# Patient Record
Sex: Female | Born: 1965 | Race: Black or African American | Hispanic: No | Marital: Single | Smoking: Current every day smoker
Health system: Southern US, Community
[De-identification: ages and names within clinical notes are randomized; demographics above are authoritative.]

## PROBLEM LIST (undated history)

## (undated) DIAGNOSIS — K219 Gastro-esophageal reflux disease without esophagitis: Secondary | ICD-10-CM

## (undated) DIAGNOSIS — IMO0001 Reserved for inherently not codable concepts without codable children: Secondary | ICD-10-CM

## (undated) DIAGNOSIS — K3184 Gastroparesis: Secondary | ICD-10-CM

## (undated) HISTORY — PX: MYOMECTOMY: SHX85

---

## 2005-08-19 ENCOUNTER — Emergency Department (HOSPITAL_COMMUNITY): Admission: EM | Admit: 2005-08-19 | Discharge: 2005-08-19 | Payer: Self-pay | Admitting: *Deleted

## 2005-12-11 ENCOUNTER — Emergency Department (HOSPITAL_COMMUNITY): Admission: EM | Admit: 2005-12-11 | Discharge: 2005-12-11 | Payer: Self-pay | Admitting: Emergency Medicine

## 2006-03-14 ENCOUNTER — Emergency Department (HOSPITAL_COMMUNITY): Admission: EM | Admit: 2006-03-14 | Discharge: 2006-03-14 | Payer: Self-pay | Admitting: Emergency Medicine

## 2006-05-06 ENCOUNTER — Emergency Department (HOSPITAL_COMMUNITY): Admission: EM | Admit: 2006-05-06 | Discharge: 2006-05-06 | Payer: Self-pay | Admitting: Emergency Medicine

## 2006-07-10 ENCOUNTER — Encounter: Admission: RE | Admit: 2006-07-10 | Discharge: 2006-07-10 | Payer: Self-pay | Admitting: Internal Medicine

## 2006-08-22 ENCOUNTER — Emergency Department (HOSPITAL_COMMUNITY): Admission: EM | Admit: 2006-08-22 | Discharge: 2006-08-22 | Payer: Self-pay | Admitting: Emergency Medicine

## 2006-10-23 ENCOUNTER — Emergency Department (HOSPITAL_COMMUNITY): Admission: EM | Admit: 2006-10-23 | Discharge: 2006-10-23 | Payer: Self-pay | Admitting: Emergency Medicine

## 2006-11-05 ENCOUNTER — Encounter (INDEPENDENT_AMBULATORY_CARE_PROVIDER_SITE_OTHER): Payer: Self-pay | Admitting: Obstetrics and Gynecology

## 2006-11-05 ENCOUNTER — Inpatient Hospital Stay (HOSPITAL_COMMUNITY): Admission: RE | Admit: 2006-11-05 | Discharge: 2006-11-08 | Payer: Self-pay | Admitting: Obstetrics and Gynecology

## 2006-11-16 ENCOUNTER — Emergency Department (HOSPITAL_COMMUNITY): Admission: EM | Admit: 2006-11-16 | Discharge: 2006-11-16 | Payer: Self-pay | Admitting: Emergency Medicine

## 2006-12-29 ENCOUNTER — Emergency Department (HOSPITAL_COMMUNITY): Admission: EM | Admit: 2006-12-29 | Discharge: 2006-12-30 | Payer: Self-pay | Admitting: Emergency Medicine

## 2007-04-22 ENCOUNTER — Inpatient Hospital Stay (HOSPITAL_COMMUNITY): Admission: EM | Admit: 2007-04-22 | Discharge: 2007-04-23 | Payer: Self-pay | Admitting: Emergency Medicine

## 2007-05-05 ENCOUNTER — Ambulatory Visit (HOSPITAL_COMMUNITY): Admission: RE | Admit: 2007-05-05 | Discharge: 2007-05-05 | Payer: Self-pay | Admitting: Gastroenterology

## 2007-08-06 ENCOUNTER — Emergency Department (HOSPITAL_COMMUNITY): Admission: EM | Admit: 2007-08-06 | Discharge: 2007-08-06 | Payer: Self-pay | Admitting: Emergency Medicine

## 2007-11-18 ENCOUNTER — Emergency Department (HOSPITAL_COMMUNITY): Admission: EM | Admit: 2007-11-18 | Discharge: 2007-11-18 | Payer: Self-pay | Admitting: Emergency Medicine

## 2008-08-26 ENCOUNTER — Emergency Department (HOSPITAL_COMMUNITY): Admission: EM | Admit: 2008-08-26 | Discharge: 2008-08-26 | Payer: Self-pay | Admitting: Emergency Medicine

## 2008-10-23 ENCOUNTER — Emergency Department (HOSPITAL_COMMUNITY): Admission: EM | Admit: 2008-10-23 | Discharge: 2008-10-23 | Payer: Self-pay | Admitting: Emergency Medicine

## 2008-10-25 ENCOUNTER — Emergency Department (HOSPITAL_COMMUNITY): Admission: EM | Admit: 2008-10-25 | Discharge: 2008-10-25 | Payer: Self-pay | Admitting: Emergency Medicine

## 2008-11-08 ENCOUNTER — Encounter: Admission: RE | Admit: 2008-11-08 | Discharge: 2008-11-08 | Payer: Self-pay | Admitting: Gastroenterology

## 2008-12-09 ENCOUNTER — Ambulatory Visit (HOSPITAL_COMMUNITY): Admission: RE | Admit: 2008-12-09 | Discharge: 2008-12-09 | Payer: Self-pay | Admitting: Gastroenterology

## 2008-12-11 ENCOUNTER — Emergency Department (HOSPITAL_COMMUNITY): Admission: EM | Admit: 2008-12-11 | Discharge: 2008-12-11 | Payer: Self-pay | Admitting: Emergency Medicine

## 2009-01-19 ENCOUNTER — Emergency Department (HOSPITAL_COMMUNITY): Admission: EM | Admit: 2009-01-19 | Discharge: 2009-01-19 | Payer: Self-pay | Admitting: Emergency Medicine

## 2009-01-22 ENCOUNTER — Emergency Department (HOSPITAL_COMMUNITY): Admission: EM | Admit: 2009-01-22 | Discharge: 2009-01-22 | Payer: Self-pay | Admitting: Emergency Medicine

## 2009-04-17 ENCOUNTER — Emergency Department (HOSPITAL_COMMUNITY): Admission: EM | Admit: 2009-04-17 | Discharge: 2009-04-17 | Payer: Self-pay | Admitting: Emergency Medicine

## 2009-06-02 ENCOUNTER — Emergency Department (HOSPITAL_COMMUNITY): Admission: EM | Admit: 2009-06-02 | Discharge: 2009-06-03 | Payer: Self-pay | Admitting: Emergency Medicine

## 2009-08-25 IMAGING — NM NM HEPATO W/GB/PHARM/[PERSON_NAME]
1 series · 1 of 1 positions shown · non-contrast
Comparison: The prior examination 05/05/2007.

CLINICAL DATA: Nausea and vomiting.

NUCLEAR MEDICINE HEPATOBILIARY IMAGING WITH GALLBLADDER EF
TECHNIQUE: Sequential images of the abdomen were obtained [DATE]
minutes following intravenous administration of
radiopharmaceutical.  After the slow intravenous infusion of
uCg Cholecystokinin, the gallbladder ejection fraction was
determined.
Radiopharmaceutical:  5.5 mCi Nc-MMm Choletec

[gallbladder · 1 of 1 slices shown]
[im 1/1]
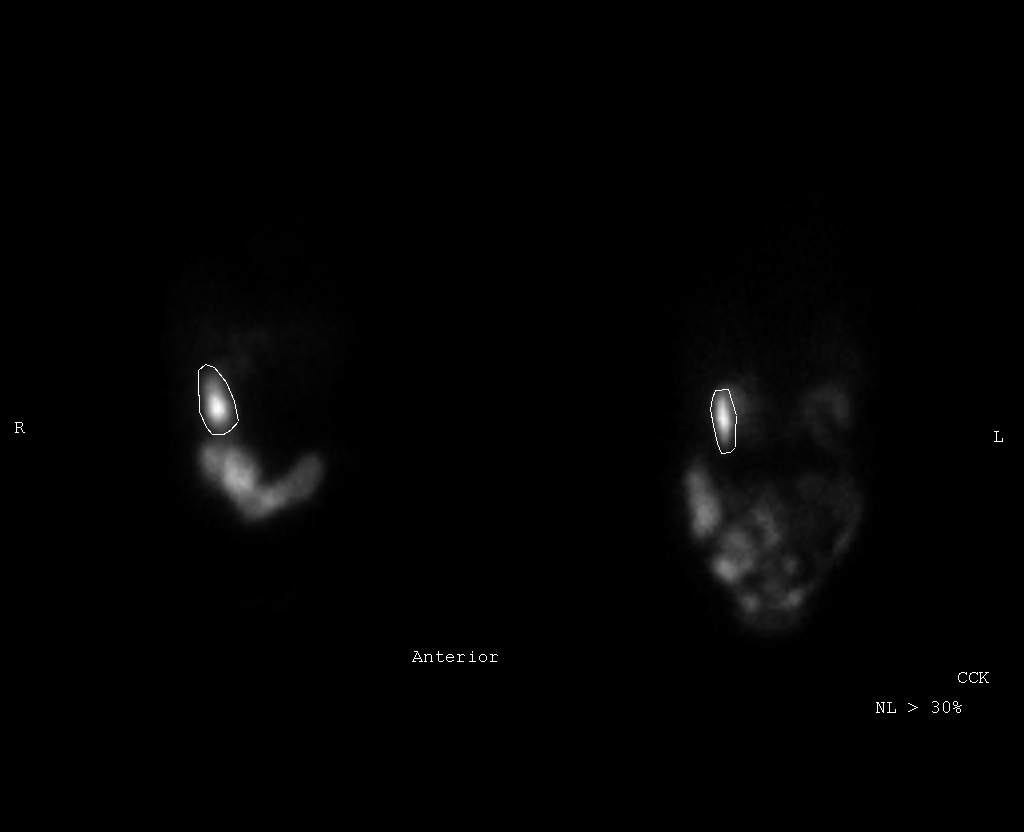

[1 of 1 positions shown; findings below may reference images not displayed]

FINDINGS: Initial images demonstrate homogenous hepatic activity
and prompt filling of the gallbladder and biliary system.  There is
spontaneous small bowel activity.

The stimulated portion of the study demonstrates some gallbladder
contraction and progressive small bowel activity.  The gallbladder
ejection fraction calculated at approximately 30 minutes is 49%%.
Normal ejection fractions are considered greater than 30%.

The patient did experience symptoms (severe nausea and abdominal
pain) during CCK administration.
IMPRESSION: 1.  The cystic and common bile ducts remain patent.
2.  Gallbladder ejection fraction is within normal limits.
3.  The patient did experience severe nausea and abdominal pain
during CCK administration.

## 2009-09-11 ENCOUNTER — Emergency Department (HOSPITAL_COMMUNITY): Admission: EM | Admit: 2009-09-11 | Discharge: 2009-09-11 | Payer: Self-pay | Admitting: Emergency Medicine

## 2009-10-09 ENCOUNTER — Emergency Department (HOSPITAL_COMMUNITY): Admission: EM | Admit: 2009-10-09 | Discharge: 2009-10-09 | Payer: Self-pay | Admitting: Emergency Medicine

## 2009-11-05 ENCOUNTER — Emergency Department (HOSPITAL_COMMUNITY): Admission: EM | Admit: 2009-11-05 | Discharge: 2009-11-05 | Payer: Self-pay | Admitting: Emergency Medicine

## 2009-11-09 ENCOUNTER — Emergency Department (HOSPITAL_COMMUNITY): Admission: EM | Admit: 2009-11-09 | Discharge: 2009-11-09 | Payer: Self-pay | Admitting: Emergency Medicine

## 2010-02-05 ENCOUNTER — Emergency Department (HOSPITAL_COMMUNITY): Admission: EM | Admit: 2010-02-05 | Discharge: 2010-02-06 | Payer: Self-pay | Admitting: Emergency Medicine

## 2010-04-27 ENCOUNTER — Emergency Department (HOSPITAL_COMMUNITY): Admission: EM | Admit: 2010-04-27 | Discharge: 2009-08-05 | Payer: Self-pay | Admitting: Emergency Medicine

## 2010-06-29 ENCOUNTER — Inpatient Hospital Stay (HOSPITAL_COMMUNITY)
Admission: EM | Admit: 2010-06-29 | Discharge: 2010-06-30 | DRG: 392 | Disposition: A | Discharge status: Home / Self Care | Payer: 59 | Attending: Internal Medicine | Admitting: Internal Medicine

## 2010-06-29 ENCOUNTER — Emergency Department (HOSPITAL_COMMUNITY): Payer: 59

## 2010-06-29 DIAGNOSIS — F17200 Nicotine dependence, unspecified, uncomplicated: Secondary | ICD-10-CM | POA: Diagnosis present

## 2010-06-29 DIAGNOSIS — D72829 Elevated white blood cell count, unspecified: Secondary | ICD-10-CM | POA: Diagnosis present

## 2010-06-29 DIAGNOSIS — E860 Dehydration: Secondary | ICD-10-CM | POA: Diagnosis present

## 2010-06-29 DIAGNOSIS — K449 Diaphragmatic hernia without obstruction or gangrene: Secondary | ICD-10-CM | POA: Diagnosis present

## 2010-06-29 DIAGNOSIS — K828 Other specified diseases of gallbladder: Secondary | ICD-10-CM | POA: Diagnosis present

## 2010-06-29 DIAGNOSIS — K838 Other specified diseases of biliary tract: Secondary | ICD-10-CM | POA: Diagnosis present

## 2010-06-29 DIAGNOSIS — R112 Nausea with vomiting, unspecified: Principal | ICD-10-CM | POA: Diagnosis present

## 2010-06-29 DIAGNOSIS — Z880 Allergy status to penicillin: Secondary | ICD-10-CM

## 2010-06-29 LAB — COMPREHENSIVE METABOLIC PANEL
BUN: 8 mg/dL (ref 6–23)
CO2: 25 mEq/L (ref 19–32)
Chloride: 104 mEq/L (ref 96–112)
Creatinine, Ser: 0.89 mg/dL (ref 0.4–1.2)
GFR calc Af Amer: >60 mL/min (ref >60)
GFR calc non Af Amer: >60 mL/min (ref >60)
Glucose, Bld: 138 mg/dL — ABNORMAL HIGH (ref 70–99)
Potassium: 4.1 mEq/L (ref 3.5–5.1)
Sodium: 139 mEq/L (ref 135–145)

## 2010-06-29 LAB — CBC
HCT: 39.2 % (ref 36.0–46.0)
MCH: 31.1 pg (ref 26.0–34.0)
MCHC: 34.7 g/dL (ref 30.0–36.0)
MCV: 89.5 fL (ref 78.0–100.0)
Platelets: 252 10*3/uL (ref 150–400)
RBC: 4.38 MIL/uL (ref 3.87–5.11)
RDW: 13.3 % (ref 11.5–15.5)
WBC: 11.1 10*3/uL — ABNORMAL HIGH (ref 4.0–10.5)

## 2010-06-29 LAB — DIFFERENTIAL
Basophils Absolute: 0 10*3/uL (ref 0.0–0.1)
Lymphocytes Relative: 31 % (ref 12–46)

## 2010-06-29 LAB — URINALYSIS, ROUTINE W REFLEX MICROSCOPIC
Hgb urine dipstick: NEGATIVE
Specific Gravity, Urine: 1.02 (ref 1.005–1.030)
pH: 6 (ref 5.0–8.0)

## 2010-06-30 ENCOUNTER — Ambulatory Visit (HOSPITAL_COMMUNITY): Admission: RE | Admit: 2010-06-30 | Payer: 59 | Source: Ambulatory Visit

## 2010-06-30 LAB — BASIC METABOLIC PANEL
Calcium: 9.8 mg/dL (ref 8.4–10.5)
Creatinine, Ser: 0.66 mg/dL (ref 0.4–1.2)
GFR calc Af Amer: >60 mL/min (ref >60)

## 2010-06-30 LAB — HEMOGLOBIN A1C
Hgb A1c MFr Bld: 6.2 % — ABNORMAL HIGH (ref <5.7)
Mean Plasma Glucose: 131 mg/dL — ABNORMAL HIGH (ref <117)

## 2010-06-30 LAB — CBC
MCH: 30.5 pg (ref 26.0–34.0)
MCHC: 34.8 g/dL (ref 30.0–36.0)
Platelets: 213 10*3/uL (ref 150–400)
RDW: 13.2 % (ref 11.5–15.5)

## 2010-07-02 NOTE — Discharge Summary (Signed)
[name], [name]             ACCOUNT NO.:  [id number]  MEDICAL RECORD NO.:  [id number]           PATIENT TYPE:  I  LOCATION:  1340                         FACILITY:  [facility]  PHYSICIAN:  Talmage Nap, MD  DATE OF BIRTH:  [birth year]  DATE OF ADMISSION:  06/29/2010 DATE OF DISCHARGE:  06/30/2010                        DISCHARGE SUMMARY - REFERRING   The patient is being discharged on her request and demand.  PRIMARY CARE PHYSICIAN:  Merlene Laughter. Renae Gloss, M.D.  GASTROENTEROLOGIST:  Anselmo Rod, MD, [facility]  DISCHARGE DIAGNOSES: 1. 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 2. History of hiatal hernia. 3. History of esophagitis. 4. History of hyperglycemia. 5. Leukocytosis, source is unknown.  HISTORY OF PRESENT ILLNESS:  The patient is a 45 year old African- American female with history of hiatal hernia and esophagitis who was admitted to the hospital on June 29, 2010, by Dr. Isidoro Donning with complaints of nausea and vomiting, which she said to be cyclic every month.  She, however, denied any history of fever.  She denied any chills.  She denied any abdominal discomfort.  She denied any diarrhea.  She denied any dysuria or hematuria.  The nausea and the vomiting was said to be becoming intractable.  Hence, the patient presented to the emergency room to be evaluated.  PREADMISSION MEDICATIONS:  Include Prevacid taken on a p.r.n. basis.  PAST SURGICAL HISTORY: 1. Hernia repair. 2. Myomectomy.  ALLERGIES:  PENICILLIN.  SOCIAL HISTORY:  The patient smokes, could not quantify how much she smokes.  Denies any history of alcohol or illicit drug use.  FAMILY HISTORY:  Was said to be positive for diabetes mellitus.  REVIEW OF SYSTEMS:  Essentially documented in the initial history and physical.  PHYSICAL EXAMINATION:  VITAL SIGNS:  At time the patient was seen by the admitting physician blood pressure  was 191/95, heart rate 78, respiratory rate 18, temperature was 98.6, she was saturating 98% on room air. HEENT:  Pupils were reactive to light and extraocular muscles are intact. NECK:  She had no jugular venous distention.  No carotid bruit.  No lymphadenopathy. CHEST:  Was said to be clear to auscultation.  Heart sounds are 1 and 2. ABDOMEN:  Said to be soft with tenderness on deep palpation.  Liver, spleen and kidney palpable.  Bowel sounds are positive. EXTREMITIES:  No pedal edema. NEUROLOGIC:  Exam was nonfocal. MUSCULOSKELETAL SYSTEM:  Unremarkable. SKIN:  Showed no ecchymosis.  LABORATORY DATA:  Initial pregnancy test done was negative.  Complete blood count with differential showed WBC 11.1, hemoglobin 13.6, hematocrit of 39.2, MCV 89.5 with a platelet count of 252.  Normal differential.  Urinalysis unremarkable.  Comprehensive metabolic panel showed sodium of 139, potassium of 4.1, chloride of 104 with a bicarbonate of 25, glucose is 138, BUN is 8, creatinine 0.89.  LFTs normal.  Lipase 19.  A repeat complete blood count with no differential done on June 30, 2010, showed WBC of 12.8, hemoglobin of 12.8, hematocrit of 36.8, MCV 87.8, platelet count of 213.  Basic metabolic panel showed sodium of 132, potassium of 3.5, chloride of  101, with a bicarbonate of 22, glucose is 135, BUN is 6, creatinine 0.66. Hemoglobin A1c 6.2.  RADIOLOGIC:  Imaging studies done on the patient include plain abdominal x-ray which was unremarkable.  HIDA scan-the patient declined to do it.  HOSPITAL COURSE:  The patient was admitted to the regular floor.  She was started on normal saline IV to go at a rate of 125 cc an hour.  Pain control was done with Tylenol.  She was given Protonix 40 mg IV q.24 as well as Zofran 4 mg IV q.4 p.r.n. and she was placed on SCD boots for DVT prophylaxis.  The patient was seen by me for the very first time in this index admission on June 30, 2010, at 9:15  a.m.  She complained of nausea and examination showed the patient to be ill-looking with suboptimal hydration.  Her vital signs then was: blood pressure 140/82, temperature 99.2, pulse 60, respiratory rate 18.  In this encounter Phenergan 12.5 mg IV q.6 p.r.n. for nausea and vomiting was added to the patient's regimen and she was scheduled to have a HIDA scan done.  However, I was called back at about 12:37pm that patient was demanding to see me and she wanted to be discharged home today.  Again on reevaluation, the patient was still ill looking, but she persistently demanded to be discharged.Attempts at convincing patient to stay was unsuccessful. Plan is for the patient to be discharged home today.  DISCHARGE MEDICATIONS:  Medications to be taken at home will include: 1. Reglan 10 mg p.o. t.i.d. 2. Prevacid over-the-counter one p.o. daily p.r.n.     Talmage Nap, MD     CN/MEDQ  D:  06/30/2010  T:  06/30/2010  Job:  [id number]  cc:   Merlene Laughter. Renae Gloss, M.D. Fax: [phone]  JXBJYN WGN FAOZ, MD, [facility] Fax: [phone]  Electronically Signed by Talmage Nap  on 06/30/2010 02:02:09 PM

## 2010-08-03 LAB — BASIC METABOLIC PANEL
BUN: 25 mg/dL — ABNORMAL HIGH (ref 6–23)
Calcium: 9.7 mg/dL (ref 8.4–10.5)
Creatinine, Ser: 2.2 mg/dL — ABNORMAL HIGH (ref 0.4–1.2)
GFR calc non Af Amer: 24 mL/min — ABNORMAL LOW (ref >60)
GFR calc non Af Amer: 37 mL/min — ABNORMAL LOW (ref >60)
Potassium: 3.4 mEq/L — ABNORMAL LOW (ref 3.5–5.1)
Sodium: 138 mEq/L (ref 135–145)

## 2010-08-03 LAB — COMPREHENSIVE METABOLIC PANEL
Albumin: 5 g/dL (ref 3.5–5.2)
Alkaline Phosphatase: 90 U/L (ref 39–117)
BUN: 25 mg/dL — ABNORMAL HIGH (ref 6–23)
Calcium: 11.1 mg/dL — ABNORMAL HIGH (ref 8.4–10.5)
Potassium: 3.1 mEq/L — ABNORMAL LOW (ref 3.5–5.1)
Total Protein: 10.3 g/dL — ABNORMAL HIGH (ref 6.0–8.3)

## 2010-08-03 LAB — URINE MICROSCOPIC-ADD ON

## 2010-08-03 LAB — URINALYSIS, ROUTINE W REFLEX MICROSCOPIC
Glucose, UA: 100 mg/dL — AB
Nitrite: NEGATIVE
Specific Gravity, Urine: 1.038 — ABNORMAL HIGH (ref 1.005–1.030)
pH: 5.5 (ref 5.0–8.0)

## 2010-08-03 LAB — DIFFERENTIAL
Basophils Relative: 0 % (ref 0–1)
Lymphocytes Relative: 15 % (ref 12–46)
Lymphs Abs: 2.6 10*3/uL (ref 0.7–4.0)
Monocytes Absolute: 1.3 10*3/uL — ABNORMAL HIGH (ref 0.1–1.0)
Monocytes Relative: 8 % (ref 3–12)
Neutro Abs: 13.3 10*3/uL — ABNORMAL HIGH (ref 1.7–7.7)

## 2010-08-03 LAB — POCT PREGNANCY, URINE: Preg Test, Ur: NEGATIVE

## 2010-08-03 LAB — CBC
MCHC: 33.8 g/dL (ref 30.0–36.0)
MCV: 91.6 fL (ref 78.0–100.0)
Platelets: 301 10*3/uL (ref 150–400)
RDW: 14.4 % (ref 11.5–15.5)
WBC: 17.2 10*3/uL — ABNORMAL HIGH (ref 4.0–10.5)

## 2010-08-03 LAB — URINE CULTURE
Colony Count: 25000
Culture  Setup Time: 201109190015

## 2010-08-06 LAB — URINALYSIS, ROUTINE W REFLEX MICROSCOPIC
Leukocytes, UA: NEGATIVE
Leukocytes, UA: NEGATIVE
Nitrite: NEGATIVE
Nitrite: NEGATIVE
Specific Gravity, Urine: 1.039 — ABNORMAL HIGH (ref 1.005–1.030)
Specific Gravity, Urine: 1.026 (ref 1.005–1.030)
Urobilinogen, UA: 0.2 mg/dL (ref 0.0–1.0)
Urobilinogen, UA: 1 mg/dL (ref 0.0–1.0)
pH: 5.5 (ref 5.0–8.0)

## 2010-08-06 LAB — DIFFERENTIAL
Basophils Absolute: 0.1 10*3/uL (ref 0.0–0.1)
Basophils Relative: 1 % (ref 0–1)
Lymphocytes Relative: 16 % (ref 12–46)
Monocytes Absolute: 1.5 10*3/uL — ABNORMAL HIGH (ref 0.1–1.0)
Neutro Abs: 11.2 10*3/uL — ABNORMAL HIGH (ref 1.7–7.7)
Neutrophils Relative %: 74 % (ref 43–77)

## 2010-08-06 LAB — URINE CULTURE
Colony Count: NO GROWTH
Culture: NO GROWTH

## 2010-08-06 LAB — COMPREHENSIVE METABOLIC PANEL
Alkaline Phosphatase: 81 U/L (ref 39–117)
BUN: 20 mg/dL (ref 6–23)
CO2: 30 mEq/L (ref 19–32)
Chloride: 91 mEq/L — ABNORMAL LOW (ref 96–112)
Creatinine, Ser: 1.56 mg/dL — ABNORMAL HIGH (ref 0.4–1.2)
GFR calc non Af Amer: 36 mL/min — ABNORMAL LOW (ref >60)
Glucose, Bld: 177 mg/dL — ABNORMAL HIGH (ref 70–99)
Potassium: 3 mEq/L — ABNORMAL LOW (ref 3.5–5.1)
Total Bilirubin: 0.9 mg/dL (ref 0.3–1.2)

## 2010-08-06 LAB — POCT I-STAT, CHEM 8
Calcium, Ion: 1.12 mmol/L (ref 1.12–1.32)
Chloride: 101 mEq/L (ref 96–112)
Glucose, Bld: 139 mg/dL — ABNORMAL HIGH (ref 70–99)
Glucose, Bld: 185 mg/dL — ABNORMAL HIGH (ref 70–99)
HCT: 52 % — ABNORMAL HIGH (ref 36.0–46.0)
HCT: 54 % — ABNORMAL HIGH (ref 36.0–46.0)
Hemoglobin: 17.7 g/dL — ABNORMAL HIGH (ref 12.0–15.0)
Hemoglobin: 18.4 g/dL — ABNORMAL HIGH (ref 12.0–15.0)
Potassium: 2.5 mEq/L — CL (ref 3.5–5.1)
Potassium: 3.3 mEq/L — ABNORMAL LOW (ref 3.5–5.1)
Sodium: 139 mEq/L (ref 135–145)

## 2010-08-06 LAB — URINE MICROSCOPIC-ADD ON

## 2010-08-06 LAB — CBC
HCT: 45.7 % (ref 36.0–46.0)
Hemoglobin: 15.6 g/dL — ABNORMAL HIGH (ref 12.0–15.0)
MCV: 90.7 fL (ref 78.0–100.0)
Platelets: 267 10*3/uL (ref 150–400)
WBC: 15.2 10*3/uL — ABNORMAL HIGH (ref 4.0–10.5)

## 2010-08-06 LAB — POCT PREGNANCY, URINE: Preg Test, Ur: NEGATIVE

## 2010-08-08 LAB — URINE MICROSCOPIC-ADD ON

## 2010-08-08 LAB — DIFFERENTIAL
Eosinophils Relative: 0 % (ref 0–5)
Lymphocytes Relative: 29 % (ref 12–46)
Lymphs Abs: 3.7 10*3/uL (ref 0.7–4.0)
Monocytes Relative: 11 % (ref 3–12)

## 2010-08-08 LAB — COMPREHENSIVE METABOLIC PANEL
ALT: 15 U/L (ref 0–35)
AST: 19 U/L (ref 0–37)
Albumin: 4.5 g/dL (ref 3.5–5.2)
Calcium: 10 mg/dL (ref 8.4–10.5)
Creatinine, Ser: 1.62 mg/dL — ABNORMAL HIGH (ref 0.4–1.2)
GFR calc Af Amer: 42 mL/min — ABNORMAL LOW (ref >60)
GFR calc non Af Amer: 35 mL/min — ABNORMAL LOW (ref >60)
Sodium: 138 mEq/L (ref 135–145)
Total Protein: 9 g/dL — ABNORMAL HIGH (ref 6.0–8.3)

## 2010-08-08 LAB — URINALYSIS, ROUTINE W REFLEX MICROSCOPIC
Nitrite: NEGATIVE
Protein, ur: >300 mg/dL — AB
Specific Gravity, Urine: 1.033 — ABNORMAL HIGH (ref 1.005–1.030)
Urobilinogen, UA: 1 mg/dL (ref 0.0–1.0)

## 2010-08-08 LAB — CBC
MCHC: 33.3 g/dL (ref 30.0–36.0)
MCV: 92.3 fL (ref 78.0–100.0)
Platelets: 277 10*3/uL (ref 150–400)
RDW: 14.1 % (ref 11.5–15.5)

## 2010-08-08 LAB — URINE CULTURE
Colony Count: NO GROWTH
Culture: NO GROWTH

## 2010-08-08 LAB — LIPASE, BLOOD: Lipase: 20 U/L (ref 11–59)

## 2010-08-10 NOTE — H&P (Signed)
NAMEJAQUITA, BESSIRE             ACCOUNT NO.:  [id number]  MEDICAL RECORD NO.:  [id number]           PATIENT TYPE:  E  LOCATION:  WLED                         FACILITY:  [facility]  PHYSICIAN:  Dr. Isidoro Donning                DATE OF BIRTH:  [birth year]  DATE OF ADMISSION:  06/29/2010 DATE OF DISCHARGE:                             HISTORY & PHYSICAL   PRIMARY CARE PHYSICIAN:  Cala Bradford R. Renae Gloss, MD  GASTROENTEROLOGIST:  Anselmo Rod, MD, [facility]  CHIEF COMPLAINT:  Nausea, vomiting.  HISTORY OF PRESENT ILLNESS:  Ms. Driggs is a 45 year old African American female with past medical history of hiatal hernia, esophagitis and prior admissions for intractable nausea and vomiting, who presents with onset of vomiting this morning.  The patient reports similar symptoms associated with monthly menses.  She denies any recent fever, chills, chest pain, abdominal pain, or diarrhea.  She denies melena or hematemesis.  The patient states the Reglan is usually effective at relieving her symptoms; however, she is unable to tolerate p.o. Reglan at this time and IV Reglan is unavailable in the health system.  The patient has undergone workup in the emergency department.  She has received IV Zofran twice as well as IM Phenergan x1 with no relief of symptoms.  The patient is slightly sedated on exam as she did receive Phenergan and IV Ativan to attempt to relieve symptoms.  The patient is to be admitted at this time by triad hospitalist for further evaluation and treatment.  PAST MEDICAL HISTORY: 1. Hiatal hernia per EGD done December 2008. 2. History of upper gastrointestinal bleed secondary to esophagitis     2008. 3. History of hyperglycemia. 4. Remote hernia repair. 5. Exploratory laparotomy, finding uterine fibroids done in June 2008. 6. Tobacco use. 7. Prior admissions for intractable nausea and vomiting.  MEDICATIONS:  Prevacid taken p.r.n.  ALLERGIES:  PENICILLIN with unknown reaction.  FAMILY  HISTORY:  Unable to obtain as the patient is sedated; however, chart review shows family history of diabetes.  SOCIAL HISTORY:  Per sister the patient is single.  She lives alone. She works full-time.  She is a current smoker.  Sister is unaware of how much the patient smokes.  Sister denies any alcohol or illegal drug use.  REVIEW OF SYSTEMS:  As stated in the HPI, however limited secondary to the patient's sedation.  PHYSICAL EXAMINATION:  VITAL SIGNS:  Blood pressure 191/95, heart rate 78, respirations 18, temperature 98.6, O2 sat is 98% on room air. GENERAL:  This is a [place] American female lying supine in stretcher, sleeping in no acute distress. HEENT:  Head is normocephalic, atraumatic.  Eyes, extraocular movements are intact.  No scleral icterus or injection.  Ear, nose and throat, mucous membranes are moist with no oropharyngeal lesions. NECK:  Supple with no thyromegaly or lymphadenopathy.  No JVD or carotid bruits. CHEST:  With symmetrical movement, nontender to palpation. CARDIOVASCULAR:  S1 and S2, regular rate and rhythm.  No murmur, rub, or gallop. RESPIRATORY:  Lung sounds are clear to auscultation bilaterally anteriorly with no wheezes, rales, or crackles.  No increased work of breathing. GI:  Abdomen is soft, nondistended.  The patient does have some grimacing with palpation throughout without any rebound or guarding.  No appreciated masses or hepatosplenomegaly.  Bowel sounds are normoactive. EXTREMITIES:  No lower extremity edema. NEUROLOGICAL:  The patient is able to move all extremities x4 without motor sensory deficit. PSYCHIATRY:  Psychologically, the patient is sedated; however, she is arousable to voice and light touch.  LABORATORY DATA:  White cell count 11.1, platelet count 252, hemoglobin 13.6, hematocrit 39.2, sodium 139, potassium 4.1, chloride 104, CO2 25, BUN 8, creatinine 0.89, serum glucose 138, total bilirubin 0.7, alkaline phosphatase 78,  AST 21, ALT 15, albumin 4.2, lipase 19.  Urinalysis, yellow cloudy with specific gravity of 1.021.  Negative for ketones, protein, nitrites, and leukocytes.  Urine pregnancy is negative.  ASSESSMENT/PLAN: 1. Intractable nausea, vomiting.  Unclear etiology. As mentioned above,     the patient has history of same. Uncertain whether the patient's     symptoms are due to cyclic vomiting syndrome versus viral etiology     versus mechanical versus other.  Again, the patient states symptoms     are associated with premenstrual states.  We will admit the patient     overnight for supportive treatment to include IV Zofran both     scheduled and p.r.n. as IV Reglan is unavailable at the Health     System.  No evidence of infectious etiology as the patient is     afebrile and nontoxic-appearing.  Urinalysis is unremarkable.  We     will check plain film to rule out any acute process.  We will     consider GI consult if the patient's symptoms persist.  The patient     to be on clear diet to advance as tolerated. 2. Leukocytosis, mild.  Suspect a stress reaction in setting of above     problems.  Again, no sign of infectious     etiology as the patient is afebrile and nontoxic-appearing.  We     will continue to monitor. 3. Hiatal hernia.  We will order for PPI therapy. 4. Prophylaxis order for SCDs for DVT prophylaxis.     Cordelia Pen, NP   ______________________________ Dr. Isidoro Donning    LE/MEDQ  D:  06/29/2010  T:  06/29/2010  Job:  [id number]  cc:   Merlene Laughter. Renae Gloss, M.D. Fax: [phone]  MWUXLK GMW NUUV, MD, [facility] Fax: [phone]  Electronically Signed by Cordelia Pen NP on 07/17/2010 09:24:44 AM Electronically Signed by Lindalee Huizinga  on 08/10/2010 10:56:48 AM

## 2010-08-13 LAB — COMPREHENSIVE METABOLIC PANEL
ALT: 18 U/L (ref 0–35)
Alkaline Phosphatase: 95 U/L (ref 39–117)
BUN: 22 mg/dL (ref 6–23)
CO2: 28 mEq/L (ref 19–32)
Calcium: 10.5 mg/dL (ref 8.4–10.5)
GFR calc non Af Amer: 42 mL/min — ABNORMAL LOW (ref >60)
Glucose, Bld: 166 mg/dL — ABNORMAL HIGH (ref 70–99)
Potassium: 3.1 mEq/L — ABNORMAL LOW (ref 3.5–5.1)
Sodium: 136 mEq/L (ref 135–145)

## 2010-08-13 LAB — DIFFERENTIAL
Basophils Relative: 0 % (ref 0–1)
Eosinophils Absolute: 0 10*3/uL (ref 0.0–0.7)
Neutro Abs: 11.4 10*3/uL — ABNORMAL HIGH (ref 1.7–7.7)
Neutrophils Relative %: 76 % (ref 43–77)

## 2010-08-13 LAB — CBC
HCT: 47.7 % — ABNORMAL HIGH (ref 36.0–46.0)
Hemoglobin: 15.8 g/dL — ABNORMAL HIGH (ref 12.0–15.0)
MCHC: 33 g/dL (ref 30.0–36.0)
RBC: 5.16 MIL/uL — ABNORMAL HIGH (ref 3.87–5.11)

## 2010-08-13 LAB — URINALYSIS, ROUTINE W REFLEX MICROSCOPIC
Leukocytes, UA: NEGATIVE
Nitrite: NEGATIVE
Protein, ur: >300 mg/dL — AB
Urobilinogen, UA: 0.2 mg/dL (ref 0.0–1.0)

## 2010-08-13 LAB — PREGNANCY, URINE: Preg Test, Ur: NEGATIVE

## 2010-08-16 ENCOUNTER — Emergency Department (HOSPITAL_COMMUNITY): Payer: 59

## 2010-08-16 ENCOUNTER — Emergency Department (HOSPITAL_COMMUNITY)
Admission: EM | Admit: 2010-08-16 | Discharge: 2010-08-16 | Disposition: A | Discharge status: Home / Self Care | Payer: 59 | Attending: Emergency Medicine | Admitting: Emergency Medicine

## 2010-08-16 ENCOUNTER — Encounter (HOSPITAL_COMMUNITY): Payer: Self-pay

## 2010-08-16 DIAGNOSIS — N289 Disorder of kidney and ureter, unspecified: Secondary | ICD-10-CM | POA: Insufficient documentation

## 2010-08-16 DIAGNOSIS — N852 Hypertrophy of uterus: Secondary | ICD-10-CM | POA: Insufficient documentation

## 2010-08-16 DIAGNOSIS — K219 Gastro-esophageal reflux disease without esophagitis: Secondary | ICD-10-CM | POA: Insufficient documentation

## 2010-08-16 DIAGNOSIS — R112 Nausea with vomiting, unspecified: Secondary | ICD-10-CM | POA: Insufficient documentation

## 2010-08-16 LAB — CBC
Hemoglobin: 16.1 g/dL — ABNORMAL HIGH (ref 12.0–15.0)
MCH: 30.7 pg (ref 26.0–34.0)
MCV: 90.3 fL (ref 78.0–100.0)
RBC: 5.24 MIL/uL — ABNORMAL HIGH (ref 3.87–5.11)

## 2010-08-16 LAB — COMPREHENSIVE METABOLIC PANEL
ALT: 20 U/L (ref 0–35)
AST: 28 U/L (ref 0–37)
CO2: 30 mEq/L (ref 19–32)
Chloride: 92 mEq/L — ABNORMAL LOW (ref 96–112)
Creatinine, Ser: 1.55 mg/dL — ABNORMAL HIGH (ref 0.4–1.2)
GFR calc Af Amer: 44 mL/min — ABNORMAL LOW (ref >60)
GFR calc non Af Amer: 36 mL/min — ABNORMAL LOW (ref >60)
Total Bilirubin: 1.5 mg/dL — ABNORMAL HIGH (ref 0.3–1.2)

## 2010-08-16 LAB — POCT I-STAT, CHEM 8
BUN: 34 mg/dL — ABNORMAL HIGH (ref 6–23)
Chloride: 97 mEq/L (ref 96–112)
Creatinine, Ser: 1.7 mg/dL — ABNORMAL HIGH (ref 0.4–1.2)
Hemoglobin: 17.7 g/dL — ABNORMAL HIGH (ref 12.0–15.0)
Potassium: 4 mEq/L (ref 3.5–5.1)
Sodium: 137 mEq/L (ref 135–145)

## 2010-08-23 LAB — URINALYSIS, ROUTINE W REFLEX MICROSCOPIC
Bilirubin Urine: NEGATIVE
Glucose, UA: NEGATIVE mg/dL
Leukocytes, UA: NEGATIVE
Nitrite: NEGATIVE
Protein, ur: 100 mg/dL — AB
Specific Gravity, Urine: 1.025 (ref 1.005–1.030)
Urobilinogen, UA: 0.2 mg/dL (ref 0.0–1.0)
pH: 5 (ref 5.0–8.0)

## 2010-08-23 LAB — DIFFERENTIAL
Basophils Absolute: 0 10*3/uL (ref 0.0–0.1)
Basophils Relative: 0 % (ref 0–1)
Eosinophils Absolute: 0 10*3/uL (ref 0.0–0.7)
Monocytes Absolute: 1.3 10*3/uL — ABNORMAL HIGH (ref 0.1–1.0)
Neutro Abs: 9.8 10*3/uL — ABNORMAL HIGH (ref 1.7–7.7)
Neutrophils Relative %: 69 % (ref 43–77)

## 2010-08-23 LAB — COMPREHENSIVE METABOLIC PANEL WITH GFR
ALT: 16 U/L (ref 0–35)
AST: 22 U/L (ref 0–37)
Albumin: 4.9 g/dL (ref 3.5–5.2)
Alkaline Phosphatase: 78 U/L (ref 39–117)
BUN: 44 mg/dL — ABNORMAL HIGH (ref 6–23)
CO2: 28 meq/L (ref 19–32)
Calcium: 10.4 mg/dL (ref 8.4–10.5)
Chloride: 91 meq/L — ABNORMAL LOW (ref 96–112)
Creatinine, Ser: 2.49 mg/dL — ABNORMAL HIGH (ref 0.4–1.2)
GFR calc non Af Amer: 21 mL/min — ABNORMAL LOW
Glucose, Bld: 157 mg/dL — ABNORMAL HIGH (ref 70–99)
Potassium: 3.3 meq/L — ABNORMAL LOW (ref 3.5–5.1)
Sodium: 135 meq/L (ref 135–145)
Total Bilirubin: 1.1 mg/dL (ref 0.3–1.2)
Total Protein: 9.7 g/dL — ABNORMAL HIGH (ref 6.0–8.3)

## 2010-08-23 LAB — CBC
HCT: 47.5 % — ABNORMAL HIGH (ref 36.0–46.0)
Hemoglobin: 15.9 g/dL — ABNORMAL HIGH (ref 12.0–15.0)
MCHC: 33.5 g/dL (ref 30.0–36.0)
MCV: 92.7 fL (ref 78.0–100.0)
Platelets: 280 K/uL (ref 150–400)
RBC: 5.13 MIL/uL — ABNORMAL HIGH (ref 3.87–5.11)
RDW: 13.8 % (ref 11.5–15.5)
WBC: 14.3 K/uL — ABNORMAL HIGH (ref 4.0–10.5)

## 2010-08-23 LAB — LIPASE, BLOOD: Lipase: 13 U/L (ref 11–59)

## 2010-08-23 LAB — URINE MICROSCOPIC-ADD ON

## 2010-08-25 LAB — COMPREHENSIVE METABOLIC PANEL
ALT: 14 U/L (ref 0–35)
ALT: 16 U/L (ref 0–35)
AST: 20 U/L (ref 0–37)
AST: 27 U/L (ref 0–37)
Albumin: 4.1 g/dL (ref 3.5–5.2)
Albumin: 4.7 g/dL (ref 3.5–5.2)
Alkaline Phosphatase: 75 U/L (ref 39–117)
CO2: 32 mEq/L (ref 19–32)
Calcium: 9.9 mg/dL (ref 8.4–10.5)
Chloride: 95 mEq/L — ABNORMAL LOW (ref 96–112)
GFR calc Af Amer: 42 mL/min — ABNORMAL LOW (ref >60)
GFR calc Af Amer: >60 mL/min (ref >60)
Potassium: 2.8 mEq/L — ABNORMAL LOW (ref 3.5–5.1)
Sodium: 136 mEq/L (ref 135–145)
Total Bilirubin: 0.9 mg/dL (ref 0.3–1.2)
Total Protein: 8.2 g/dL (ref 6.0–8.3)

## 2010-08-25 LAB — URINE MICROSCOPIC-ADD ON

## 2010-08-25 LAB — URINALYSIS, ROUTINE W REFLEX MICROSCOPIC
Bilirubin Urine: NEGATIVE
Glucose, UA: NEGATIVE mg/dL
Specific Gravity, Urine: 1.03 (ref 1.005–1.030)
Specific Gravity, Urine: 1.033 — ABNORMAL HIGH (ref 1.005–1.030)
Urobilinogen, UA: 1 mg/dL (ref 0.0–1.0)
pH: 6 (ref 5.0–8.0)

## 2010-08-25 LAB — CBC
MCHC: 33.7 g/dL (ref 30.0–36.0)
Platelets: 261 10*3/uL (ref 150–400)
Platelets: 284 10*3/uL (ref 150–400)
RBC: 4.66 MIL/uL (ref 3.87–5.11)
RDW: 13.8 % (ref 11.5–15.5)
WBC: 17.4 10*3/uL — ABNORMAL HIGH (ref 4.0–10.5)

## 2010-08-25 LAB — DIFFERENTIAL
Basophils Absolute: 0.1 10*3/uL (ref 0.0–0.1)
Basophils Relative: 1 % (ref 0–1)
Eosinophils Absolute: 0 10*3/uL (ref 0.0–0.7)
Eosinophils Relative: 0 % (ref 0–5)
Eosinophils Relative: 0 % (ref 0–5)
Lymphocytes Relative: 18 % (ref 12–46)
Lymphs Abs: 4.4 10*3/uL — ABNORMAL HIGH (ref 0.7–4.0)
Monocytes Absolute: 0.9 10*3/uL (ref 0.1–1.0)
Monocytes Absolute: 1.6 10*3/uL — ABNORMAL HIGH (ref 0.1–1.0)
Monocytes Relative: 7 % (ref 3–12)

## 2010-08-25 LAB — POCT PREGNANCY, URINE: Preg Test, Ur: NEGATIVE

## 2010-08-27 LAB — DIFFERENTIAL
Basophils Absolute: 0.1 10*3/uL (ref 0.0–0.1)
Basophils Relative: 0 % (ref 0–1)
Eosinophils Absolute: 0 10*3/uL (ref 0.0–0.7)
Monocytes Relative: 11 % (ref 3–12)
Neutro Abs: 12 10*3/uL — ABNORMAL HIGH (ref 1.7–7.7)
Neutrophils Relative %: 67 % (ref 43–77)

## 2010-08-27 LAB — URINALYSIS, ROUTINE W REFLEX MICROSCOPIC
Glucose, UA: NEGATIVE mg/dL
Ketones, ur: 15 mg/dL — AB
Protein, ur: >300 mg/dL — AB

## 2010-08-27 LAB — URINE MICROSCOPIC-ADD ON

## 2010-08-27 LAB — COMPREHENSIVE METABOLIC PANEL
ALT: 15 U/L (ref 0–35)
Alkaline Phosphatase: 82 U/L (ref 39–117)
BUN: 27 mg/dL — ABNORMAL HIGH (ref 6–23)
CO2: 31 mEq/L (ref 19–32)
Chloride: 90 mEq/L — ABNORMAL LOW (ref 96–112)
GFR calc non Af Amer: 38 mL/min — ABNORMAL LOW (ref >60)
Glucose, Bld: 161 mg/dL — ABNORMAL HIGH (ref 70–99)
Potassium: 3.8 mEq/L (ref 3.5–5.1)
Sodium: 135 mEq/L (ref 135–145)
Total Bilirubin: 1.6 mg/dL — ABNORMAL HIGH (ref 0.3–1.2)
Total Protein: 9.3 g/dL — ABNORMAL HIGH (ref 6.0–8.3)

## 2010-08-27 LAB — CBC
HCT: 46.6 % — ABNORMAL HIGH (ref 36.0–46.0)
Hemoglobin: 15.7 g/dL — ABNORMAL HIGH (ref 12.0–15.0)
RBC: 5.15 MIL/uL — ABNORMAL HIGH (ref 3.87–5.11)
RDW: 14.2 % (ref 11.5–15.5)

## 2010-08-27 LAB — POCT PREGNANCY, URINE: Preg Test, Ur: NEGATIVE

## 2010-08-27 LAB — LIPASE, BLOOD: Lipase: 13 U/L (ref 11–59)

## 2010-08-28 LAB — DIFFERENTIAL
Basophils Relative: 0 % (ref 0–1)
Basophils Relative: 0 % (ref 0–1)
Eosinophils Relative: 0 % (ref 0–5)
Eosinophils Relative: 0 % (ref 0–5)
Lymphocytes Relative: 14 % (ref 12–46)
Lymphs Abs: 2.8 10*3/uL (ref 0.7–4.0)
Monocytes Absolute: 1.7 10*3/uL — ABNORMAL HIGH (ref 0.1–1.0)
Monocytes Relative: 10 % (ref 3–12)
Monocytes Relative: 9 % (ref 3–12)
Neutro Abs: 14.8 10*3/uL — ABNORMAL HIGH (ref 1.7–7.7)
Neutro Abs: 8.6 10*3/uL — ABNORMAL HIGH (ref 1.7–7.7)

## 2010-08-28 LAB — URINALYSIS, ROUTINE W REFLEX MICROSCOPIC
Glucose, UA: >1000 mg/dL — AB
Glucose, UA: NEGATIVE mg/dL
Hgb urine dipstick: NEGATIVE
Leukocytes, UA: NEGATIVE
Protein, ur: >300 mg/dL — AB
Specific Gravity, Urine: 1.035 — ABNORMAL HIGH (ref 1.005–1.030)

## 2010-08-28 LAB — COMPREHENSIVE METABOLIC PANEL
AST: 27 U/L (ref 0–37)
AST: 36 U/L (ref 0–37)
Albumin: 4 g/dL (ref 3.5–5.2)
Albumin: 4.9 g/dL (ref 3.5–5.2)
Alkaline Phosphatase: 96 U/L (ref 39–117)
BUN: 39 mg/dL — ABNORMAL HIGH (ref 6–23)
Calcium: 9.8 mg/dL (ref 8.4–10.5)
Chloride: 88 mEq/L — ABNORMAL LOW (ref 96–112)
Chloride: 94 mEq/L — ABNORMAL LOW (ref 96–112)
Creatinine, Ser: 1.07 mg/dL (ref 0.4–1.2)
GFR calc Af Amer: 25 mL/min — ABNORMAL LOW (ref >60)
GFR calc Af Amer: >60 mL/min (ref >60)
Potassium: 3.4 mEq/L — ABNORMAL LOW (ref 3.5–5.1)
Total Bilirubin: 1.5 mg/dL — ABNORMAL HIGH (ref 0.3–1.2)
Total Protein: 8.1 g/dL (ref 6.0–8.3)
Total Protein: 9.3 g/dL — ABNORMAL HIGH (ref 6.0–8.3)

## 2010-08-28 LAB — CBC
HCT: 50.7 % — ABNORMAL HIGH (ref 36.0–46.0)
MCHC: 34.1 g/dL (ref 30.0–36.0)
MCV: 91.5 fL (ref 78.0–100.0)
Platelets: 215 10*3/uL (ref 150–400)
Platelets: 222 10*3/uL (ref 150–400)
RDW: 13.7 % (ref 11.5–15.5)
RDW: 13.7 % (ref 11.5–15.5)
WBC: 12.7 10*3/uL — ABNORMAL HIGH (ref 4.0–10.5)
WBC: 19.3 10*3/uL — ABNORMAL HIGH (ref 4.0–10.5)

## 2010-08-28 LAB — URINE MICROSCOPIC-ADD ON

## 2010-08-28 LAB — POCT PREGNANCY, URINE: Preg Test, Ur: NEGATIVE

## 2010-08-30 LAB — URINALYSIS, ROUTINE W REFLEX MICROSCOPIC
Nitrite: NEGATIVE
Urobilinogen, UA: 0.2 mg/dL (ref 0.0–1.0)
pH: 6 (ref 5.0–8.0)

## 2010-08-30 LAB — URINE MICROSCOPIC-ADD ON

## 2010-08-30 LAB — POCT I-STAT, CHEM 8
Chloride: 105 mEq/L (ref 96–112)
Glucose, Bld: 145 mg/dL — ABNORMAL HIGH (ref 70–99)
HCT: 45 % (ref 36.0–46.0)
Hemoglobin: 15.3 g/dL — ABNORMAL HIGH (ref 12.0–15.0)
Potassium: 3.2 mEq/L — ABNORMAL LOW (ref 3.5–5.1)
Sodium: 139 mEq/L (ref 135–145)

## 2010-08-30 LAB — PREGNANCY, URINE: Preg Test, Ur: NEGATIVE

## 2010-10-01 ENCOUNTER — Emergency Department (HOSPITAL_COMMUNITY)
Admission: EM | Admit: 2010-10-01 | Discharge: 2010-10-01 | Disposition: A | Discharge status: Home / Self Care | Payer: 59 | Attending: Emergency Medicine | Admitting: Emergency Medicine

## 2010-10-01 ENCOUNTER — Emergency Department (HOSPITAL_COMMUNITY): Payer: 59

## 2010-10-01 DIAGNOSIS — K219 Gastro-esophageal reflux disease without esophagitis: Secondary | ICD-10-CM | POA: Insufficient documentation

## 2010-10-01 DIAGNOSIS — R112 Nausea with vomiting, unspecified: Secondary | ICD-10-CM | POA: Insufficient documentation

## 2010-10-01 DIAGNOSIS — N189 Chronic kidney disease, unspecified: Secondary | ICD-10-CM | POA: Insufficient documentation

## 2010-10-01 DIAGNOSIS — E876 Hypokalemia: Secondary | ICD-10-CM | POA: Insufficient documentation

## 2010-10-01 LAB — CBC
HCT: 46.4 % — ABNORMAL HIGH (ref 36.0–46.0)
Hemoglobin: 16.2 g/dL — ABNORMAL HIGH (ref 12.0–15.0)
MCHC: 34.9 g/dL (ref 30.0–36.0)
MCV: 87.9 fL (ref 78.0–100.0)
RDW: 13.1 % (ref 11.5–15.5)

## 2010-10-01 LAB — URINALYSIS, ROUTINE W REFLEX MICROSCOPIC
Nitrite: NEGATIVE
Protein, ur: >300 mg/dL — AB
Specific Gravity, Urine: 1.038 — ABNORMAL HIGH (ref 1.005–1.030)
Urobilinogen, UA: 0.2 mg/dL (ref 0.0–1.0)

## 2010-10-01 LAB — POCT PREGNANCY, URINE: Preg Test, Ur: NEGATIVE

## 2010-10-01 LAB — COMPREHENSIVE METABOLIC PANEL
ALT: 13 U/L (ref 0–35)
Alkaline Phosphatase: 99 U/L (ref 39–117)
BUN: 29 mg/dL — ABNORMAL HIGH (ref 6–23)
CO2: 29 mEq/L (ref 19–32)
Calcium: 11 mg/dL — ABNORMAL HIGH (ref 8.4–10.5)
GFR calc non Af Amer: 31 mL/min — ABNORMAL LOW (ref >60)
Glucose, Bld: 161 mg/dL — ABNORMAL HIGH (ref 70–99)
Sodium: 136 mEq/L (ref 135–145)
Total Protein: 9.4 g/dL — ABNORMAL HIGH (ref 6.0–8.3)

## 2010-10-01 LAB — DIFFERENTIAL
Basophils Absolute: 0 10*3/uL (ref 0.0–0.1)
Eosinophils Relative: 0 % (ref 0–5)
Lymphocytes Relative: 25 % (ref 12–46)
Lymphs Abs: 4 10*3/uL (ref 0.7–4.0)
Monocytes Absolute: 1.7 10*3/uL — ABNORMAL HIGH (ref 0.1–1.0)
Monocytes Relative: 10 % (ref 3–12)
Neutro Abs: 10.3 10*3/uL — ABNORMAL HIGH (ref 1.7–7.7)

## 2010-10-01 LAB — URINE MICROSCOPIC-ADD ON

## 2010-10-01 LAB — LIPASE, BLOOD: Lipase: 10 U/L — ABNORMAL LOW (ref 11–59)

## 2010-10-03 NOTE — Op Note (Signed)
[name], [name]             ACCOUNT NO.:  [id number]   MEDICAL RECORD NO.:  [id number]          PATIENT TYPE:  INP   LOCATION:  9307                          FACILITY:  [facility]   PHYSICIAN:  Maxie Better, M.D.DATE OF BIRTH:  1965/12/27   DATE OF PROCEDURE:  11/05/2006  DATE OF DISCHARGE:                               OPERATIVE REPORT   PREOPERATIVE DIAGNOSIS:  Fibroid uterus.   PROCEDURE:  Exploratory laparotomy, multiple myomectomy.   POSTOPERATIVE DIAGNOSIS:  Fibroid uterus.   ANESTHESIA:  General.   SURGEON:  Maxie Better, M.D.   ASSISTANT:  Genia Del, M.D.   INDICATION:  This is a 45 year old gravida 0 single black female with a  large fibroid uterus who has had several emergency room visits for  nausea, vomiting which ultimately was attributed to the fibroids by the  GI specialist and who therefore recommended surgical removal.  The  patient now presents for surgical management.  Risks and benefits of the  procedure have been explained to the patient.  Consent was signed and  the patient was transferred to the operating room.  She is a TEFL teacher  witness.   PROCEDURE:  Under adequate general anesthesia the patient is placed in  the supine position.  She was sterilely prepped and draped in usual  fashion.  An indwelling Foley catheter was sterilely placed.  Examination under anesthesia prior to prepping the patient revealed a  uterus that was mobile with a large palpable lower uterine segment  fibroid and the uterus had arisen about one to two fingerbreadths above  the umbilicus.  Quarter percent Marcaine is injected along the planned  Pfannenstiel skin incision.  The Pfannenstiel skin incision was then  made, carried down to the rectus fascia.  Rectus fascia was opened  transversely.  The rectus fascia was then bluntly and sharply dissected  off the rectus muscle in superior inferior fashion.  The rectus muscles  split in midline.  The parietal  peritoneum was entered bluntly and  extended.  The exploration of the abdomen was notable for normal liver  edge, normal palpable kidneys, appendix was sought out or seen.  The  uterus had multiple fibroids.  It was subsequently exteriorized.  Both  tubes and ovaries were normal bilaterally.  There was a large about 8 cm  posterior subserosal fibroid, about 7 cm lower uterine segment fibroid  with several additional fibroids in between that. Using a dilute  solution of Pitressin, injection was made overlying both large fibroids.  Two incisions made anteriorly and about three incisions were made  posteriorly ultimately resulting in the removal of 25 fibroids.  The  endometrial cavity was clearly had been entered. The defects from where  the fibroids were removed was then closed with interrupted 0 Vicryl  figure-of-eight sutures until the serosal surface was reached at which  time 2-0 Monocryl sutures were then used to approximate the surface with  a baseball stitch fashion.  When good hemostasis was noted.  The abdomen  was copiously irrigated, suctioned. The uterus was then returned back to  the abdomen and once again the abdomen was again irrigated,  suctioned of  debris.  Intercede was placed overlying the incisions.  The parietal  peritoneum was not closed.  The rectus fascia was then closed with 0  Vicryl x2.  The subcutaneous areas irrigated.  Interrupted 2-0 plain  sutures was placed and the skin approximated with Ethicon staples.  Specimen was myoma x25.  Estimated  blood loss was 200 mL.  Intraoperative fluid was 1500 mL crystalloid.  Urine output was 100 mL clear yellow urine.  Sponge and instrument  counts were correct x2.  Complication was none.  The patient tolerated  the procedure well was transferred to recovery in stable condition.      Maxie Better, M.D.  Electronically Signed     [place]/MEDQ  D:  11/05/2006  T:  11/05/2006  Job:  [id number]

## 2010-10-03 NOTE — H&P (Signed)
[name]             ACCOUNT NO.:  [id number]   MEDICAL RECORD NO.:  [id number]          PATIENT TYPE:  INP   LOCATION:  0109                         FACILITY:  [facility]   PHYSICIAN:  Marcellus Scott, MD     DATE OF BIRTH:  February 19, 1966   DATE OF ADMISSION:  04/21/2007  DATE OF DISCHARGE:                              HISTORY & PHYSICAL   PRIMARY MEDICAL DOCTOR:  Merlene Laughter. Renae Gloss, M.D.   GASTROENTEROLOGIST:  Anselmo Rod, M.D.   CHIEF COMPLAINT:  1. Intractable nausea and vomiting.  2. Coffee-ground emesis.   HISTORY OF PRESENTING ILLNESS:  [name] is a pleasant 45 year old  African-American female patient with past medical history of gastritis.  She has had an EGD approximately 10 years ago confirming this gastritis.  In any event, the patient was in her usual state of health until two  days ago when she started experiencing nausea. Initially, she did not  have any vomiting or abdominal pain. However, since yesterday, she  started having vomiting associated with the nausea. She has had multiple  episodes since. Initially, all of the emesis was clear or yellow color.  She has no associated abdominal pain, fevers, chills, rigors, dizziness  or lightheadedness. She presented to the emergency room where she was  hydrated, received antiemetics. She seemed to improve. However, when she  was about to be discharged, she vomited 300 mL of coffee-ground emesis  following which we were asked to admit the patient. The patient  continues to deny any abdominal pain. She denies history of NSAID use,  sickly contacts, eating anything unusual.   Her last BM was yesterday which was normal brown color. She has been  passing flatus. There has been no bright red bleeding per rectum.   The patient has had multiple emergency room visits over the last year  and a half for similar complaints of nausea and vomiting. She has been  assessed to have gastritis on some of these visits.   PAST MEDICAL HISTORY:  1. Gastritis.  2. EGD 10 years ago with findings of gastritis.   PAST SURGICAL HISTORY:  1. Exploratory laparotomy for multiple myomectomies for uterine      fibroids.  2. Hernia repair.  3. Tonsillectomy.   ALLERGIES:  PENICILLIN.   MEDICATIONS:  Prevacid p.r.n.   FAMILY HISTORY:  The patient's father and uncles and aunt with history  of diabetes.   SOCIAL HISTORY:  The patient is single. 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 She is a current smoker, smoking 5 to 6 cigarettes  per day for the last 25 years. There is no history of alcohol or drug  abuse. Last menstrual period was a week ago.   REVIEW OF SYSTEMS:  Fourteen systems reviewed and apart from history of  presenting illness is noncontributory.   PHYSICAL EXAMINATION:  [name] is a moderately built and nourished  female patient in no obvious distress.  His vital signs:  Temperature 98.9 degrees Fahrenheit. Blood pressure  161/88 mmHg, pulse 74 per minute regular, respirations 20 per minute,  saturating at 100% on room  air.  HEENT:  Is nontraumatic, normocephalic. Pupils equally reactive to light  and accommodation. Oral cavity with no blood or coffee grounds noted at  this time. No pharyngeal erythema or thrush.  NECK:  Without JVD, carotid bruit, lymphadenopathy or goiter. Supple.  LYMPHATICS:  No lymphadenopathy.  RESPIRATORY SYSTEM:  Clear to auscultation bilaterally.  CARDIOVASCULAR SYSTEM:  First and second heart sounds heard. No third or  fourth heart sounds. Question murmur at the apex. No rubs, gallops or  clicks.  ABDOMEN:  Is nondistended, nontender. No organomegaly or masses  appreciated. Bowel sounds are heard but seem to be sluggish.  CENTRAL NERVOUS SYSTEM:  The patient is slightly drowsy (has received  antiemetics). Easily arousable and oriented in time, person and place.  No focal neurological deficits.  EXTREMITIES:  With no clubbing, cyanosis, or edema.  Peripheral pulses  are symmetrically felt.  SKIN:  Is without any rashes.  MUSCULOSKELETAL SYSTEM:  Noncontributory.  BREAST EXAMINATION:  Deferred.   LABORATORY DATA:  CBC with hemoglobin 12.6, hematocrit 36.4, white blood  cells 12.9, platelets 236. Gastric occult blood is positive. Gastric pH  is 3. Urine microscopy not indicative of urinary tract infection.  However, has 100 mg/dL of protein. Urine pregnancy test is negative.  Serum lipase is 12. Hepatic panel is unremarkable. Basic metabolic panel  remarkable for potassium of 3.4, glucose 181, BUN 8, creatinine 0.79.   RADIOLOGY:  Acute abdominal series with chest; impression:  Nonobstructive bowel-gas pattern, with relative paucity of small-bowel  gas.   ASSESSMENT AND PLAN:  1. Intractable nausea and vomiting with upper gastrointestinal bleed.      Differential diagnoses includes active peptic ulcer disease versus      acute gastritis. Will admit patient to medical floor. Will check      hemoglobin and hematocrit q.6h. and transfuse packed red blood      cells p.r.n. Will type and screen and hold 2 units of packed red      blood cells. Will place 2 large bore IV accesses. Will hydrate with      IV fluids and keep patient n.p.o. Will also provide b.i.d. IV      protein-pump inhibitors. Will obtain gastrointestinal consult by      Dr. Loreta Ave for possible endoscopy today.  2. Tobacco abuse: for cessation counseling.  3. Leukocytosis, probably stress induced. There is no clinical focus      of sepsis. To monitor CBCs.  4. Hypokalemia. Will replete and follow up basic metabolic panel and      Mg.  5. Hyperglycemia. Probably stress induced but rule out diabetes      considering she has proteinuria. Will check hemoglobin A1c and      place patient on sliding scale insulin.  6. Proteinuria: unclear etiology, for outpatient workup.      Marcellus Scott, MD  Electronically Signed     AH/MEDQ  D:  04/22/2007  T:  04/22/2007   Job:  [id number]   cc:   Merlene Laughter. Renae Gloss, M.D.  Fax: [phone]   VHQION GEX BMWU, M.D.  Fax: [phone]

## 2010-10-03 NOTE — Discharge Summary (Signed)
[name], [name]             ACCOUNT NO.:  [id number]   MEDICAL RECORD NO.:  [id number]          PATIENT TYPE:  INP   LOCATION:  9307                          FACILITY:  [facility]   PHYSICIAN:  Maxie Better, M.D.DATE OF BIRTH:  10-02-1965   DATE OF ADMISSION:  11/05/2006  DATE OF DISCHARGE:  11/08/2006                               DISCHARGE SUMMARY   ADMISSION DIAGNOSIS:  Uterine fibroids.   DISCHARGE DIAGNOSIS:  Uterine fibroids.   PROCEDURE:  Exploratory laparotomy, multiple myomectomy.   HOSPITAL COURSE:  The patient was admitted to [facility].  She was  taken to the operating room where she underwent exploratory laparotomy  and multiple myomectomies.  See the dictated OP notes for the specific  details.  The patient subsequently had an uncomplicated postoperative  course.  She initially had some problems with vomiting, no flatus. Her  bowel functions are slightly slow.  She was asked to ambulate.  Her  Dilaudid was decreased and her anti-emetics were changed.  Her CBC on  postop day #1 showed a hemoglobin of 9.7, hematocrit 29.1, white count  of 19.1, platelet count 291,000.  By postop day #3, the patient who had  remained afebrile through the course is passing flatus.  She felt better  after she used her own baking soda regimen.  She had a bowel movement.  Her incision had no erythema, induration or exudates and she was deemed  well to be discharged home.   DISPOSITION:  Home.   CONDITION ON DISCHARGE:  Stable.   DISCHARGE MEDICATIONS:  1. Phenergan 25 mg 1 by mouth every 6 hours as needed for nausea and      vomiting.  2. Tylox 1-2 tablets every 3-4 hours as needed for pain.  3. Motrin 800 mg, 1 every 8 hours as needed for pain.  4. Continue iron supplement po bid   FOLLOW UP:  Appointment with OB/GYN in 4 weeks.   DISCHARGE INSTRUCTIONS:  1. Call for temperature greater than or equal to 100.4.  2. Nothing per vagina for 4-6 weeks.  3. No heavy lifting  or driving for 2 weeks.  4. Call for severe abdominal pain, nausea and vomiting, increased      incisional pain, redness or drainage from the incision site.  Soap      and a regular pad every hour or more frequently.      Maxie Better, M.D.  Electronically Signed     [place]/MEDQ  D:  12/18/2006  T:  12/19/2006  Job:  [id number]

## 2010-10-03 NOTE — Consult Note (Signed)
NAMESHONITA, RINCK             ACCOUNT NO.:  [id number]   MEDICAL RECORD NO.:  [id number]          PATIENT TYPE:  INP   LOCATION:  1508                         FACILITY:  [facility]   PHYSICIAN:  Jordan Hawks. Elnoria Howard, MD    DATE OF BIRTH:  30-Jul-1965   DATE OF CONSULTATION:  04/22/2007  DATE OF DISCHARGE:                                 CONSULTATION   PRIMARY CARE Gwyn Hieronymus:  Dr. Andi Devon.   REASON FOR CONSULTATION:  Hematemesis.   HISTORY OF PRESENT ILLNESS:  This is a 45 year old female with a past  medical history of gastritis, umbilical hernia repair, tonsillectomy and  hysterectomy for uterine fibroids.  Was admitted to the hospital with  complaints of nausea, vomiting and hematemesis.  The patient presented  to the ER stating that she had acute onset of nausea 2 days prior to  admission.  However, on the day before admission, she started to have  vomiting.  She has a long history of nausea and vomiting in the past,  and she was evaluated by Dr. Loreta Ave previously.  Also, in the distant  past, when she was living in a [place], she also underwent EGD by a  GI physician.  She told to that she had reflux disease.  Apparently no  medical therapies have been able to help with her symptoms and she  continues have intermittent nausea.  The patient denies any use of  NSAIDS.  At the time of discharge from the emergency room, the patient  had 300 mL of coffee-ground emesis which prompted her admission.   PAST MEDICAL AND SURGICAL HISTORY:  As stated above.   FAMILY HISTORY:  Noncontributory.   MEDICATIONS:  The patient states Prevacid on p.r.n. basis.   ALLERGIES:  PENICILLIN.   SOCIAL HISTORY:  Positive for tobacco, no alcohol or illicit drug use.   REVIEW OF SYSTEMS:  As above in history present illness, otherwise  negative.   PHYSICAL EXAMINATION:  VITAL SIGNS:  Blood pressure is 163/88, heart  rate is 77, respirations 19 about temperature is 99.2.  General:  The patient  is in no acute distress, alert and oriented.  HEENT:  Normocephalic, atraumatic.  Extraocular muscles intact.  NECK:  Supple.  No lymphadenopathy.  LUNGS:  Clear to auscultation bilaterally.  CARDIOVASCULAR:  Regular rhythm.  ABDOMEN:  Mildly obese, soft, nontender, nondistended.  Positive bowel  sounds.  EXTREMITIES:  No clubbing, cyanosis or edema.   LABORATORY VALUES:  White blood cell count 12.9, hemoglobin 12.6, MCV is  87.1, platelets at 236.  PT is 13.0, INR 1.0, PTT is 30.  Sodium 140,  potassium 3.4, chloride 101, CO2 25, glucose 181, BUN 8, creatinine 2.7   IMPRESSION:  Upper gastrointestinal bleed.  It is apparent that the  patient has had some bleeding, and further evaluation in the EGD is  required.  Upon the findings of the EGD, further recommendations will be  made.      Jordan Hawks Elnoria Howard, MD  Electronically Signed     [facility]/MEDQ  D:  04/22/2007  T:  04/23/2007  Job:  [id number]

## 2010-10-03 NOTE — Discharge Summary (Signed)
NAMEVESSIE, [name]             ACCOUNT NO.:  [id number]   MEDICAL RECORD NO.:  [id number]          PATIENT TYPE:  INP   LOCATION:                               [facility]AC[facility]   PHYSICIAN:  Marcellus Scott, MD     DATE OF BIRTH:  [birth year]   DATE OF ADMISSION:  04/22/2007  DATE OF DISCHARGE:  04/23/2007                               DISCHARGE SUMMARY   PMD:  Dr. Andi Devon   GASTROENTEROLOGIST:  Dr. Anselmo Rod.   DISCHARGE DIAGNOSES:  1. Intractable nausea and vomiting.  2. Upper gastrointestinal bleed.  3. Hiatal hernia.  4. Esophagitis.  5. Hypokalemia.  6. Tobacco abuse.  7. Leukocytosis.  8. Hyperglycemia.  9. Proteinuria.   DISCHARGE MEDICATIONS:  Prevacid 30 mg p.o. b.i.d.   PROCEDURES:  Esophagogastroduodenoscopy by Dr. Elnoria Howard; please refer to his  procedure note for details.  Impression:  (A) Hiatal hernia, (B)  esophagitis.   PERTINENT LABORATORY DATA:  Lipid profile with cholesterol 197,  triglycerides 98, HDL 35, LDL 142, VLDL 20, phosphorus 1.7, magnesium  2.3.  Basic metabolic panel normal with BUN 9, creat 0.99.  CBC with  hemoglobin of 12.6, hematocrit 35.5, white blood cell count 13.3,  platelet count 247,000.  Hemoglobin A1c 6.1.  Urine pregnancy test was  negative.  Lipase was 12.  Urinalysis was negative for features of UTI.   CONSULTATIONS:  GI, Dr. Jeani Hawking.   HISTORY OF PRESENT ILLNESS:  For details of history on admission, please  refer to admission note.   [name] is a 45 year old female patient with history of gastritis  confirmed on previous EGD, ongoing tobacco use, who presented with  intractable nausea and vomiting and coffee-grounds emesis.  She was  therefore admitted to the hospital for further evaluation and  management.   HOSPITAL COURSE:  The patient remained hemodynamically stable  throughout.  H&H's were checked and stable.  She was made n.p.o. and  placed on IV fluids and PPI's. GI was consulted and performed  EGD with  findings as indicated above. After EGD sdiet was advanced  which she has  tolerated.  The patient today is asymptomatic. She has had normal BM  today.  The patient is stable for discharge home.  Her hypokalemia was repleted.  Dr Elnoria Howard has suggested Nissen  fundoplication for her hiatal hernia and recommended followup by Dr.  Loreta Ave as an outpatient.  The patient has been counseled regarding tobacco  cessation.  She had hyperglycemia and was placed on sliding scale  insulin. Consider ruling out new DM as outpatient with a FBS after her  acute illness has stabilized. She also has proteinuria, unclear  etiology. Consider workup outpatient which may include referral to a  Nephrologist. Her elevated LDL is to be repeated as outpatient. The  patient has mild leukocytosis with no clinical focus of sepsis; this is  felt to be stress related.  The patient also prefers to stay on Prevacid  rather than changing to another PPI.  The patient has been instructed to  seek immedate medical attention if there is deterioration of her  condition.  She has been instructed to avoid NSAIDS, smoking and  regularize her diet.      Marcellus Scott, MD  Electronically Signed     AH/MEDQ  D:  04/23/2007  T:  04/23/2007  Job:  [id number]   cc:   Jordan Hawks. Elnoria Howard, MD  Fax: [phone]   Anselmo Rod, M.D.  Fax: [phone]   Merlene Laughter. Renae Gloss, M.D.  Fax: [phone]

## 2011-02-12 LAB — COMPREHENSIVE METABOLIC PANEL
ALT: 20
AST: 23
Albumin: 4.7
Alkaline Phosphatase: 89
CO2: 28
Chloride: 91 — ABNORMAL LOW
Creatinine, Ser: 1.5 — ABNORMAL HIGH
GFR calc Af Amer: 46 — ABNORMAL LOW
GFR calc non Af Amer: 38 — ABNORMAL LOW
Potassium: 3 — ABNORMAL LOW
Sodium: 133 — ABNORMAL LOW
Total Bilirubin: 1.1

## 2011-02-12 LAB — DIFFERENTIAL
Basophils Absolute: 0.1
Basophils Relative: 1
Eosinophils Absolute: 0
Eosinophils Relative: 0
Monocytes Absolute: 1.2 — ABNORMAL HIGH

## 2011-02-12 LAB — CBC
Hemoglobin: 16.3 — ABNORMAL HIGH
Platelets: 316
RDW: 13.5
WBC: 16.3 — ABNORMAL HIGH

## 2011-02-12 LAB — LIPASE, BLOOD: Lipase: 12

## 2011-02-15 LAB — COMPREHENSIVE METABOLIC PANEL
ALT: 15
Albumin: 4.3
Alkaline Phosphatase: 80
BUN: 11
Chloride: 104
Glucose, Bld: 142 — ABNORMAL HIGH
Potassium: 3.6
Sodium: 138
Total Bilirubin: 0.7
Total Protein: 8.6 — ABNORMAL HIGH

## 2011-02-15 LAB — URINALYSIS, ROUTINE W REFLEX MICROSCOPIC
Ketones, ur: 15 — AB
Nitrite: NEGATIVE
Specific Gravity, Urine: 1.037 — ABNORMAL HIGH
Urobilinogen, UA: 0.2

## 2011-02-15 LAB — DIFFERENTIAL
Basophils Absolute: 0
Basophils Relative: 0
Eosinophils Absolute: 0
Monocytes Absolute: 0.9
Monocytes Relative: 6

## 2011-02-15 LAB — CBC
HCT: 41.6
Hemoglobin: 14.2
Platelets: 263
RDW: 13.9
WBC: 14.2 — ABNORMAL HIGH

## 2011-02-15 LAB — URINE MICROSCOPIC-ADD ON

## 2011-02-26 LAB — URINALYSIS, ROUTINE W REFLEX MICROSCOPIC
Glucose, UA: NEGATIVE
Hgb urine dipstick: NEGATIVE
Specific Gravity, Urine: 1.038 — ABNORMAL HIGH
Urobilinogen, UA: 0.2
pH: 6.5

## 2011-02-26 LAB — BASIC METABOLIC PANEL
BUN: 9
CO2: 25
CO2: 25
Calcium: 9.7
Chloride: 101
Chloride: 104
Creatinine, Ser: 0.99
GFR calc Af Amer: >60
Glucose, Bld: 181 — ABNORMAL HIGH
Potassium: 3.4 — ABNORMAL LOW
Sodium: 140

## 2011-02-26 LAB — LIPID PANEL
Cholesterol: 197
HDL: 35 — ABNORMAL LOW
Total CHOL/HDL Ratio: 5.6
Triglycerides: 98

## 2011-02-26 LAB — HEPATIC FUNCTION PANEL
AST: 22
Bilirubin, Direct: 0.1
Indirect Bilirubin: 0.8
Total Bilirubin: 0.9

## 2011-02-26 LAB — DIFFERENTIAL
Basophils Absolute: 0
Basophils Relative: 0
Eosinophils Relative: 0
Monocytes Absolute: 0.2
Neutro Abs: 11.5 — ABNORMAL HIGH

## 2011-02-26 LAB — CBC
HCT: 36.4
Hemoglobin: 12.6
MCHC: 34.7
MCHC: 35.5
MCV: 87
Platelets: 237
RDW: 15.8 — ABNORMAL HIGH

## 2011-02-26 LAB — HEMOGLOBIN A1C: Hgb A1c MFr Bld: 5.9

## 2011-02-26 LAB — ABO/RH: ABO/RH(D): O POS

## 2011-02-26 LAB — HEMOGLOBIN AND HEMATOCRIT, BLOOD
HCT: 37.6
Hemoglobin: 12.4

## 2011-02-26 LAB — CROSSMATCH: Antibody Screen: NEGATIVE

## 2011-02-26 LAB — URINE MICROSCOPIC-ADD ON

## 2011-02-26 LAB — POCT PREGNANCY, URINE: Preg Test, Ur: NEGATIVE

## 2011-02-26 LAB — GASTRIC OCCULT BLOOD (1-CARD TO LAB): pH, Gastric: 3

## 2011-02-28 ENCOUNTER — Emergency Department (HOSPITAL_COMMUNITY)
Admission: EM | Admit: 2011-02-28 | Discharge: 2011-02-28 | Disposition: A | Discharge status: Home / Self Care | Payer: 59 | Attending: Emergency Medicine | Admitting: Emergency Medicine

## 2011-02-28 DIAGNOSIS — R112 Nausea with vomiting, unspecified: Secondary | ICD-10-CM | POA: Insufficient documentation

## 2011-02-28 DIAGNOSIS — K219 Gastro-esophageal reflux disease without esophagitis: Secondary | ICD-10-CM | POA: Insufficient documentation

## 2011-02-28 LAB — URINE MICROSCOPIC-ADD ON

## 2011-02-28 LAB — DIFFERENTIAL
Basophils Absolute: 0 10*3/uL (ref 0.0–0.1)
Basophils Relative: 0 % (ref 0–1)
Eosinophils Absolute: 0 10*3/uL (ref 0.0–0.7)
Monocytes Absolute: 1.3 10*3/uL — ABNORMAL HIGH (ref 0.1–1.0)
Neutro Abs: 10.1 10*3/uL — ABNORMAL HIGH (ref 1.7–7.7)
Neutrophils Relative %: 70 % (ref 43–77)

## 2011-02-28 LAB — COMPREHENSIVE METABOLIC PANEL
ALT: 15 U/L (ref 0–35)
AST: 18 U/L (ref 0–37)
Albumin: 4.9 g/dL (ref 3.5–5.2)
Alkaline Phosphatase: 113 U/L (ref 39–117)
Calcium: 11.5 mg/dL — ABNORMAL HIGH (ref 8.4–10.5)
GFR calc Af Amer: 79 mL/min — ABNORMAL LOW (ref >90)
Glucose, Bld: 147 mg/dL — ABNORMAL HIGH (ref 70–99)
Potassium: 3.2 mEq/L — ABNORMAL LOW (ref 3.5–5.1)
Sodium: 138 mEq/L (ref 135–145)
Total Protein: 9.9 g/dL — ABNORMAL HIGH (ref 6.0–8.3)

## 2011-02-28 LAB — URINALYSIS, ROUTINE W REFLEX MICROSCOPIC
Glucose, UA: 100 mg/dL — AB
Leukocytes, UA: NEGATIVE
Specific Gravity, Urine: 1.036 — ABNORMAL HIGH (ref 1.005–1.030)
pH: 5.5 (ref 5.0–8.0)

## 2011-02-28 LAB — CBC
Hemoglobin: 14.9 g/dL (ref 12.0–15.0)
MCHC: 35.1 g/dL (ref 30.0–36.0)
Platelets: 285 10*3/uL (ref 150–400)

## 2011-03-05 LAB — BASIC METABOLIC PANEL
BUN: 14
Chloride: 101
Creatinine, Ser: 0.77
GFR calc Af Amer: >60
GFR calc non Af Amer: >60
Potassium: 3 — ABNORMAL LOW

## 2011-03-05 LAB — DIFFERENTIAL
Lymphocytes Relative: 12
Lymphs Abs: 1.9
Monocytes Relative: 7
Neutrophils Relative %: 80 — ABNORMAL HIGH

## 2011-03-05 LAB — URINALYSIS, ROUTINE W REFLEX MICROSCOPIC
Glucose, UA: NEGATIVE
Protein, ur: >300 — AB
Urobilinogen, UA: 0.2

## 2011-03-05 LAB — POCT PREGNANCY, URINE
Operator id: 29727
Preg Test, Ur: NEGATIVE

## 2011-03-05 LAB — CBC
Platelets: 374
RBC: 4.76
WBC: 14.9 — ABNORMAL HIGH

## 2011-03-05 LAB — URINE MICROSCOPIC-ADD ON

## 2011-03-07 LAB — CBC
HCT: 31.4 — ABNORMAL LOW
Hemoglobin: 8.9 — ABNORMAL LOW
MCHC: 33.5
MCHC: 33.6
MCHC: 34
MCV: 89
Platelets: 291
Platelets: 305
Platelets: 695 — ABNORMAL HIGH
RDW: 14.2 — ABNORMAL HIGH
RDW: 14.6 — ABNORMAL HIGH
RDW: 15.8 — ABNORMAL HIGH

## 2011-03-07 LAB — COMPREHENSIVE METABOLIC PANEL
ALT: 11
Albumin: 3.5
Calcium: 9.8
GFR calc Af Amer: >60
Glucose, Bld: 125 — ABNORMAL HIGH
Sodium: 137
Total Protein: 8.2

## 2011-03-07 LAB — DIFFERENTIAL
Basophils Relative: 0
Lymphocytes Relative: 11 — ABNORMAL LOW
Monocytes Relative: 6
Neutro Abs: 15.5 — ABNORMAL HIGH

## 2011-03-07 LAB — URINE MICROSCOPIC-ADD ON

## 2011-03-07 LAB — LIPASE, BLOOD: Lipase: 13

## 2011-03-07 LAB — URINALYSIS, ROUTINE W REFLEX MICROSCOPIC
Glucose, UA: NEGATIVE
Leukocytes, UA: NEGATIVE
Nitrite: NEGATIVE
Specific Gravity, Urine: 1.025
pH: 6

## 2011-03-07 LAB — PREGNANCY, URINE: Preg Test, Ur: NEGATIVE

## 2011-03-07 LAB — TYPE AND SCREEN: ABO/RH(D): O POS

## 2011-03-07 LAB — ABO/RH: ABO/RH(D): O POS

## 2011-03-08 LAB — BASIC METABOLIC PANEL
Calcium: 10.7 — ABNORMAL HIGH
GFR calc Af Amer: 48 — ABNORMAL LOW
GFR calc non Af Amer: 40 — ABNORMAL LOW
Glucose, Bld: 167 — ABNORMAL HIGH
Sodium: 138

## 2011-03-08 LAB — CBC
Hemoglobin: 14.6
RBC: 4.96
RDW: 14.1 — ABNORMAL HIGH
WBC: 16.4 — ABNORMAL HIGH

## 2011-03-08 LAB — DIFFERENTIAL
Basophils Absolute: 0
Lymphocytes Relative: 15
Lymphs Abs: 2.4
Monocytes Absolute: 1.6 — ABNORMAL HIGH
Neutro Abs: 12.3 — ABNORMAL HIGH

## 2011-05-02 IMAGING — CT CT ABD-PELV W/O CM
2 of 4 series · 17 of 46 positions shown, 19 images · non-contrast
Comparison: 11/16/2006

CLINICAL DATA: Severe nausea and vomiting, history uterine
fibroids, acid reflux, renal dysfunction

CT ABDOMEN AND PELVIS WITHOUT CONTRAST
TECHNIQUE: Multidetector CT imaging of the abdomen and pelvis was
performed following the standard protocol without intravenous
contrast. Neither oral nor IV venous contrast utilized Breast
shield utilized.  Sagittal and coronal MPR images reconstructed
from axial data set.

[Series 2: under 200# stone no prev · axial · 0.63mm/px · z∈[-402,-37]mm · 14 of 81 slices shown, 16 images]
[im 4/81  soft-tissue]
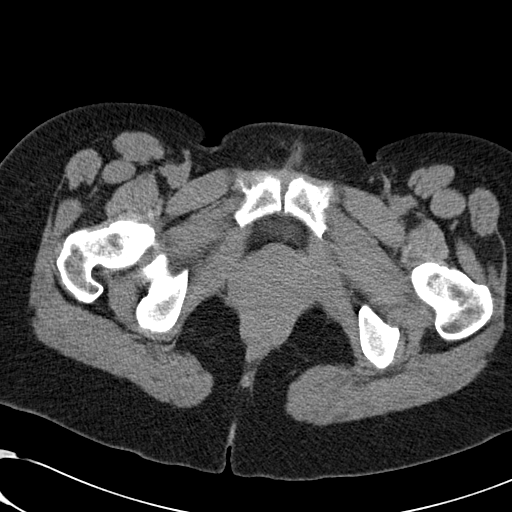
[im 4/81  bone]
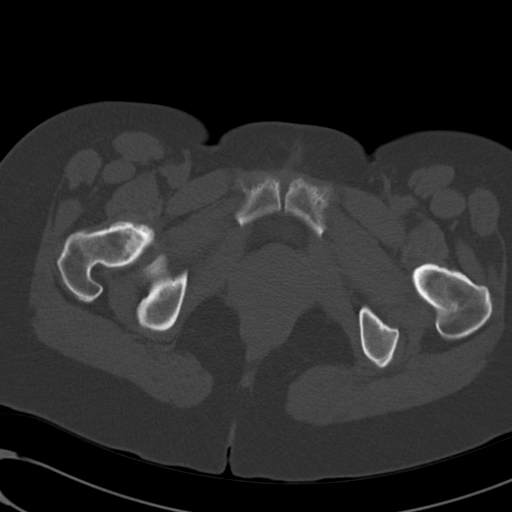
[im 11/81  soft-tissue]
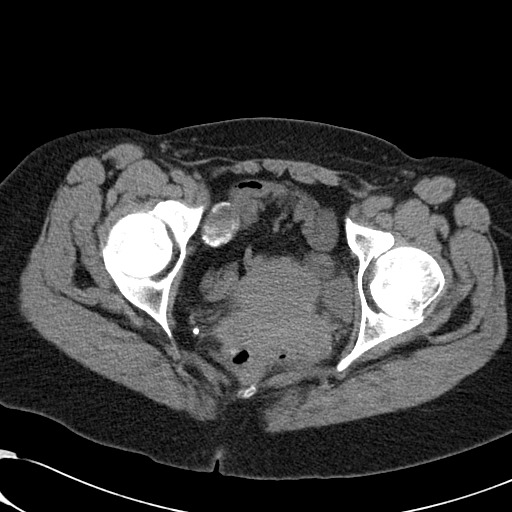
[im 17/81  soft-tissue]
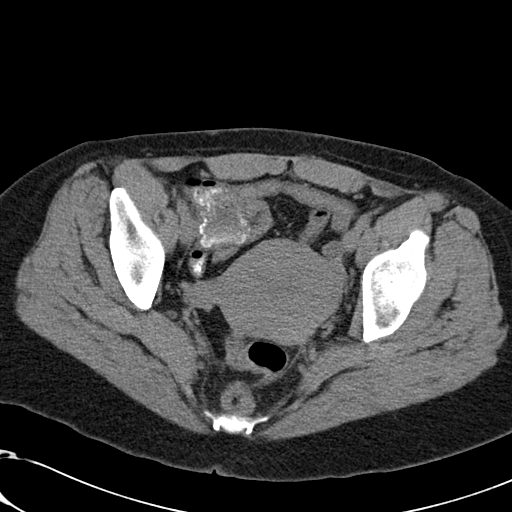
[im 21/81  soft-tissue]
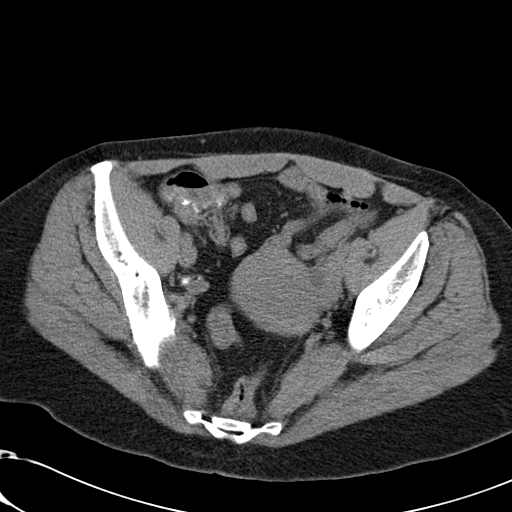
[im 27/81  soft-tissue]
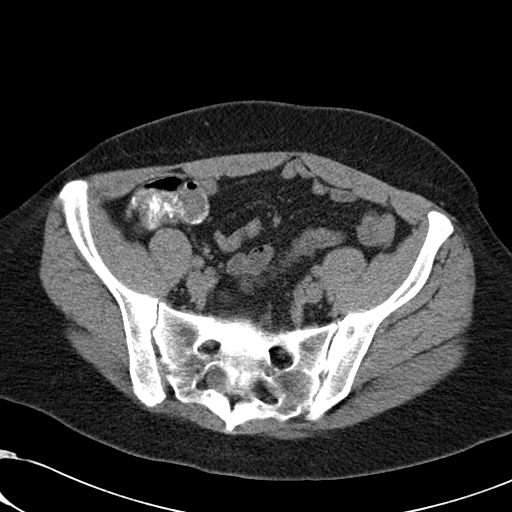
[im 34/81  soft-tissue]
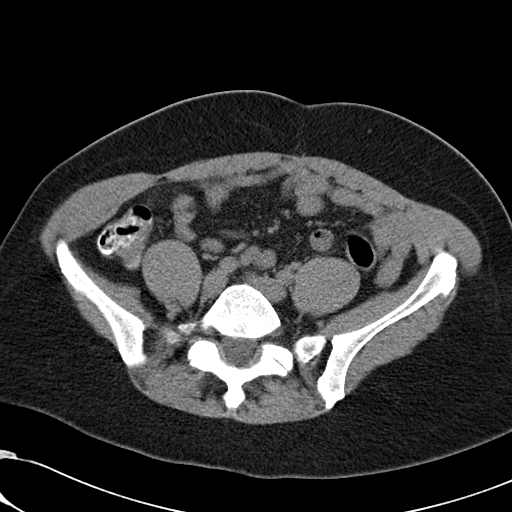
[im 37/81  soft-tissue]
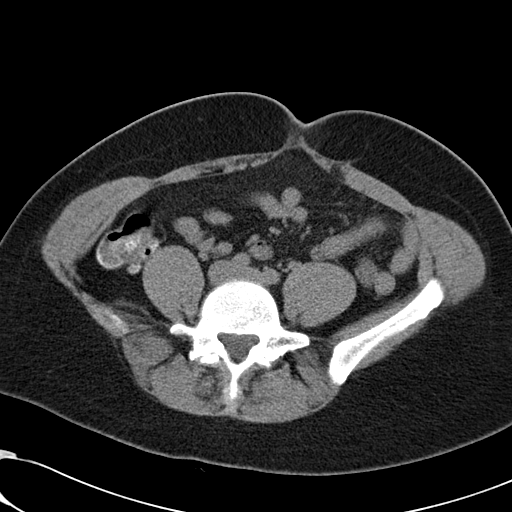
[im 44/81  soft-tissue]
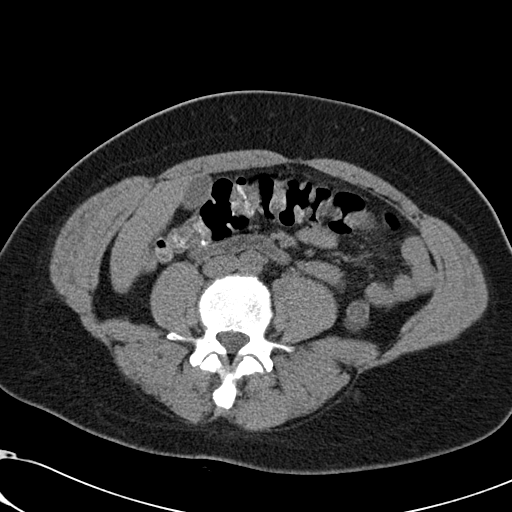
[im 47/81  soft-tissue]
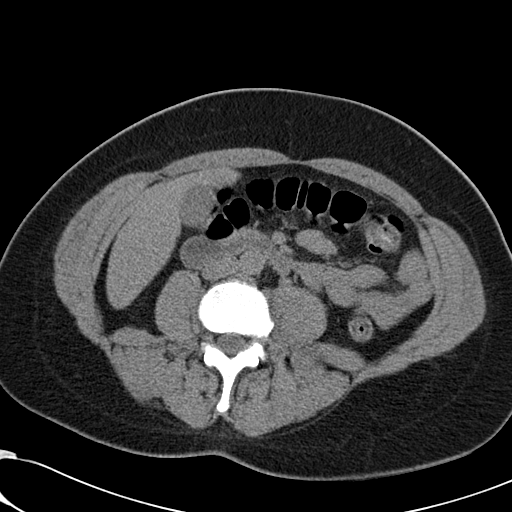
[im 47/81  bone]
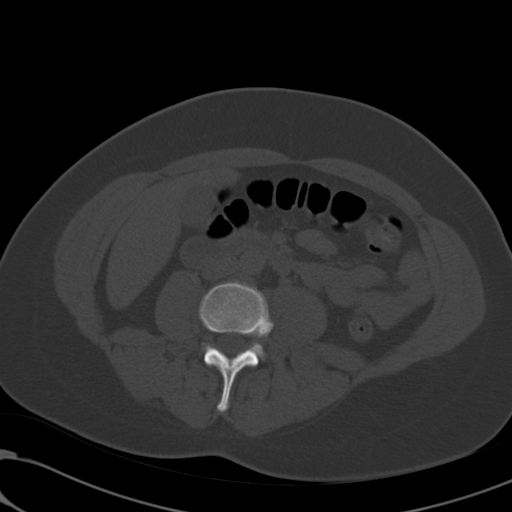
[im 54/81  soft-tissue]
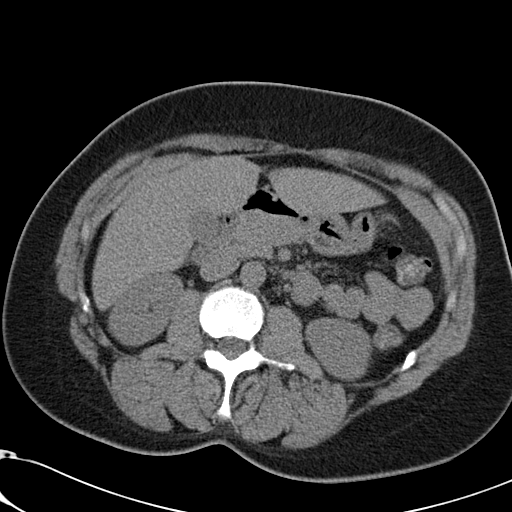
[im 61/81  soft-tissue]
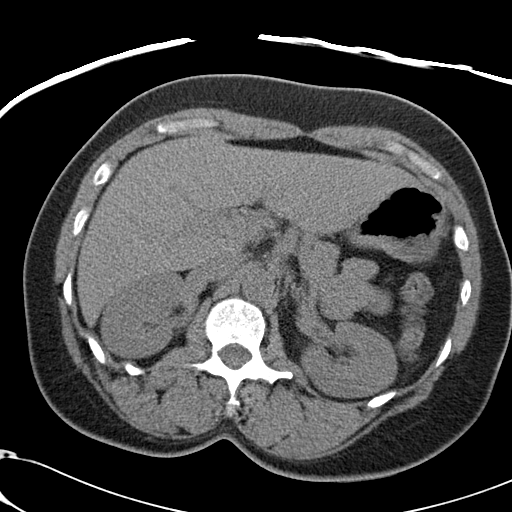
[im 64/81  soft-tissue]
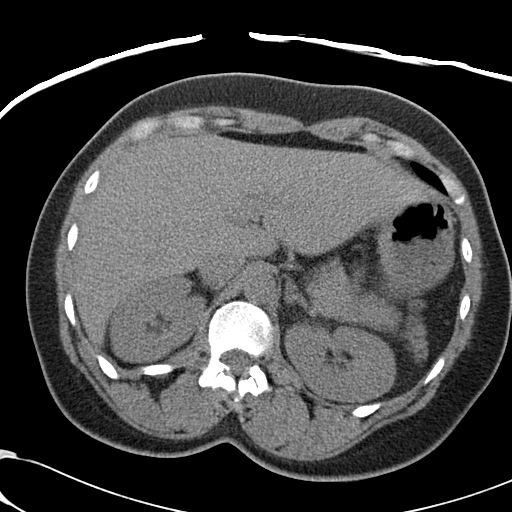
[im 71/81  soft-tissue]
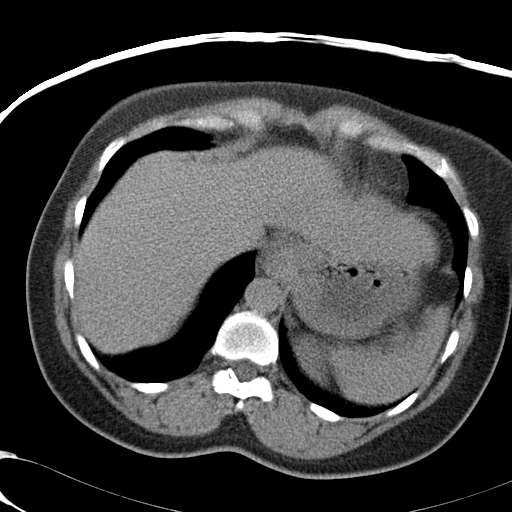
[im 77/81  soft-tissue]
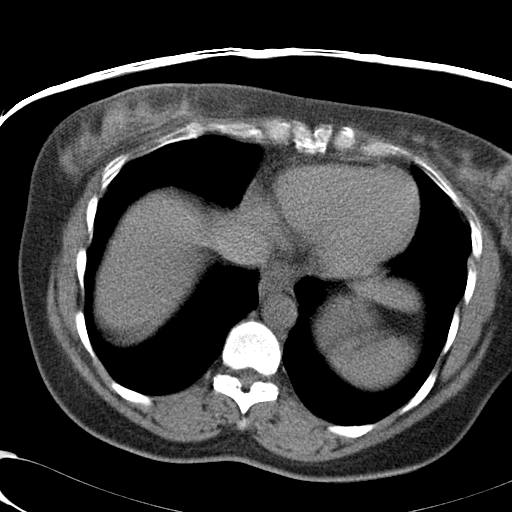

[Series 602: <mpr thick range> · coronal · 0.82mm/px · 3 of 67 slices shown]
[im 23/67  soft-tissue]
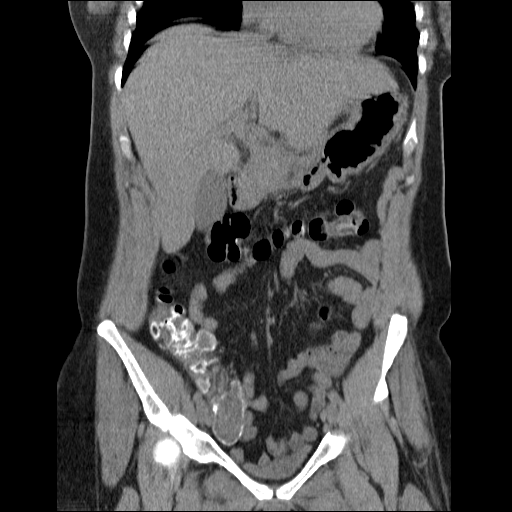
[im 30/67  soft-tissue]
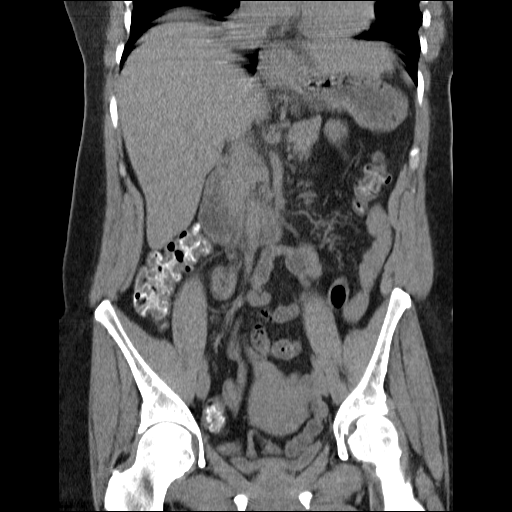
[im 37/67  soft-tissue]
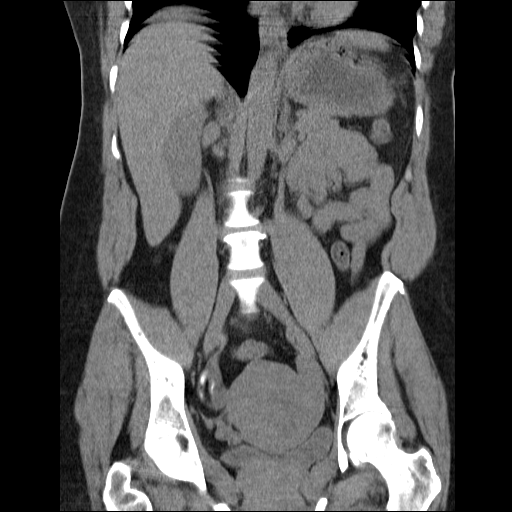

[17 of 46 positions shown; findings below may reference images not displayed]

FINDINGS: Minimal atelectasis base of left lower lobe.
Scattered respiratory motion artifacts.
Within limits of a nonenhanced exam, no focal abnormalities of the
liver, spleen, pancreas, kidneys, or adrenal glands.
Slightly prominent uterine size with nodular contour suggesting
leiomyomata.
Normal appendix.
Radiopacities identified within right colon question prior contrast
or radiodense medication ingestion.
Stomach and bowel loops grossly unremarkable for exam lacking IV
and oral contrast.
Bladder decompressed.
No definite mass, adenopathy, free fluid or inflammatory process
otherwise seen.
Minimal sclerosis along the iliac portions of the SI joints
bilaterally increased since previous exam, with SI joints appearing
smooth and symmetric, question osteitis condensans ilii.
No other focal bony abnormalities.
IMPRESSION: No definite acute intra abdominal or intrapelvic abnormalities
seen.
Enlarged slightly nodular appearing uterus question leiomyomata.

## 2011-05-30 ENCOUNTER — Emergency Department (HOSPITAL_COMMUNITY): Payer: 59

## 2011-05-30 ENCOUNTER — Encounter (HOSPITAL_COMMUNITY): Payer: Self-pay | Admitting: Emergency Medicine

## 2011-05-30 ENCOUNTER — Emergency Department (HOSPITAL_COMMUNITY)
Admission: EM | Admit: 2011-05-30 | Discharge: 2011-05-30 | Disposition: A | Discharge status: Home / Self Care | Payer: 59 | Attending: Emergency Medicine | Admitting: Emergency Medicine

## 2011-05-30 DIAGNOSIS — R1110 Vomiting, unspecified: Secondary | ICD-10-CM | POA: Insufficient documentation

## 2011-05-30 DIAGNOSIS — R1013 Epigastric pain: Secondary | ICD-10-CM | POA: Insufficient documentation

## 2011-05-30 DIAGNOSIS — K219 Gastro-esophageal reflux disease without esophagitis: Secondary | ICD-10-CM

## 2011-05-30 DIAGNOSIS — E876 Hypokalemia: Secondary | ICD-10-CM | POA: Insufficient documentation

## 2011-05-30 HISTORY — DX: Reserved for inherently not codable concepts without codable children: IMO0001

## 2011-05-30 HISTORY — DX: Gastro-esophageal reflux disease without esophagitis: K21.9

## 2011-05-30 LAB — BASIC METABOLIC PANEL
BUN: 24 mg/dL — ABNORMAL HIGH (ref 6–23)
Chloride: 92 mEq/L — ABNORMAL LOW (ref 96–112)
GFR calc non Af Amer: 55 mL/min — ABNORMAL LOW (ref >90)
Glucose, Bld: 151 mg/dL — ABNORMAL HIGH (ref 70–99)
Potassium: 2.9 mEq/L — ABNORMAL LOW (ref 3.5–5.1)

## 2011-05-30 LAB — CBC
HCT: 44.4 % (ref 36.0–46.0)
Hemoglobin: 15.7 g/dL — ABNORMAL HIGH (ref 12.0–15.0)
MCHC: 35.4 g/dL (ref 30.0–36.0)

## 2011-05-30 MED ORDER — METOCLOPRAMIDE HCL 10 MG PO TABS: 10.0000 mg | ORAL_TABLET | Freq: Four times a day (QID) | ORAL | Status: DC

## 2011-05-30 MED ORDER — PANTOPRAZOLE SODIUM 40 MG IV SOLR
40.0000 mg | Freq: Once | INTRAVENOUS | Status: AC
  Administered 2011-05-30: 40 mg via INTRAVENOUS
  Filled 2011-05-30: qty 40

## 2011-05-30 MED ORDER — SODIUM CHLORIDE 0.9 % IV SOLN
Freq: Once | INTRAVENOUS | Status: AC
  Administered 2011-05-30: 1000 mL via INTRAVENOUS

## 2011-05-30 MED ORDER — METOCLOPRAMIDE HCL 5 MG/ML IJ SOLN
10.0000 mg | Freq: Once | INTRAMUSCULAR | Status: AC
  Administered 2011-05-30: 10 mg via INTRAVENOUS
  Filled 2011-05-30: qty 2

## 2011-05-30 MED ORDER — POTASSIUM CHLORIDE ER 10 MEQ PO TBCR
20.0000 meq | EXTENDED_RELEASE_TABLET | Freq: Two times a day (BID) | ORAL | Status: DC
Start: 2011-05-30 — End: 2011-09-23

## 2011-05-30 MED ORDER — POTASSIUM CHLORIDE ER 10 MEQ PO TBCR: 20.0000 meq | EXTENDED_RELEASE_TABLET | Freq: Two times a day (BID) | ORAL | Status: DC

## 2011-05-30 MED ORDER — FAMOTIDINE IN NACL 20-0.9 MG/50ML-% IV SOLN
20.0000 mg | Freq: Once | INTRAVENOUS | Status: AC
  Administered 2011-05-30: 20 mg via INTRAVENOUS
  Filled 2011-05-30: qty 50

## 2011-05-30 NOTE — ED Provider Notes (Signed)
Medical screening examination/treatment/procedure(s) were performed by non-physician practitioner and as supervising physician I was immediately available for consultation/collaboration.   Vida Roller, MD 05/30/11 [phone]

## 2011-05-30 NOTE — ED Provider Notes (Signed)
History     CSN: [id number]  Arrival date & time 05/30/11  0215   First MD Initiated Contact with Patient 05/30/11 [phone]      Chief Complaint  Patient presents with  . Emesis    (Consider location/radiation/quality/duration/timing/severity/associated sxs/prior treatment) Patient is a 46 y.o. female presenting with vomiting. The history is provided by the patient.  Emesis  This is a recurrent problem. The current episode started 2 days ago. The problem has not changed since onset.There has been no fever. Pertinent negatives include no abdominal pain, no chills and no fever. Associated symptoms comments: She has a history of acid reflux with similar symptoms. She takes Prevacid but not on a daily basis. No fever and no complaint of abdominal or chest pain. No diarrhea. She has not seen any blood in any emesis. .    Past Medical History  Diagnosis Date  . Reflux     History reviewed. No pertinent past surgical history.  No family history on file.  History  Substance Use Topics  . Smoking status: Current Everyday Smoker    Types: Cigarettes  . Smokeless tobacco: Not on file  . Alcohol Use:     OB History    Grav Para Term Preterm Abortions TAB SAB Ect Mult Living                  Review of Systems  Constitutional: Negative for fever and chills.  HENT: Negative.   Respiratory: Negative.   Cardiovascular: Negative.   Gastrointestinal: Positive for nausea and vomiting. Negative for abdominal pain.  Musculoskeletal: Negative.   Skin: Negative.   Neurological: Negative.     Allergies  Penicillins  Home Medications  No current outpatient prescriptions on file.  BP 149/85  Pulse 96  Temp(Src) 98 F (36.7 C) (Oral)  Resp 16  Wt 185 lb (83.915 kg)  SpO2 99%  LMP 05/05/2011  Physical Exam  Constitutional: She appears well-developed and well-nourished.  HENT:  Head: Normocephalic.  Neck: Normal range of motion. Neck supple.  Cardiovascular: Normal rate and  regular rhythm.   Pulmonary/Chest: Effort normal and breath sounds normal.  Abdominal: Soft. Bowel sounds are normal. There is no tenderness. There is no rebound and no guarding.  Musculoskeletal: Normal range of motion.  Neurological: She is alert. No cranial nerve deficit.  Skin: Skin is warm and dry. No rash noted.  Psychiatric: She has a normal mood and affect.    ED Course  Procedures (including critical care time)   Labs Reviewed  CBC  BASIC METABOLIC PANEL   No results found.   No diagnosis found.    MDM  The patient declines x-ray. She states she feels symptoms are familiar and she does not want repeat x-ray that she has had in the past. Discussed slight dehydration and low potassium. Will treat with PO fluids and supplement - patient understands and agrees with care plan and requests discharge home.        Rodena Medin, PA-C 05/30/11 [phone]

## 2011-05-30 NOTE — ED Notes (Signed)
Pt alert, nad, c/o nausea with emesis, onset was Monday, pt resp even unlabored, skin pwd, denies chest pain or sob, MMM, denies recent ill contact or exposures

## 2011-05-30 NOTE — ED Notes (Signed)
Called in to speak with pt-states she has been waiting too long and wants her IV started-spoke with primary RN-will start PIV for pt-awaiting for EDP evaluation

## 2011-09-23 ENCOUNTER — Emergency Department (HOSPITAL_COMMUNITY)
Admission: EM | Admit: 2011-09-23 | Discharge: 2011-09-23 | Disposition: A | Discharge status: Home / Self Care | Payer: 59 | Attending: Emergency Medicine | Admitting: Emergency Medicine

## 2011-09-23 ENCOUNTER — Encounter (HOSPITAL_COMMUNITY): Payer: Self-pay

## 2011-09-23 DIAGNOSIS — E860 Dehydration: Secondary | ICD-10-CM

## 2011-09-23 DIAGNOSIS — E876 Hypokalemia: Secondary | ICD-10-CM

## 2011-09-23 DIAGNOSIS — K219 Gastro-esophageal reflux disease without esophagitis: Secondary | ICD-10-CM | POA: Insufficient documentation

## 2011-09-23 DIAGNOSIS — R112 Nausea with vomiting, unspecified: Secondary | ICD-10-CM | POA: Insufficient documentation

## 2011-09-23 DIAGNOSIS — F17200 Nicotine dependence, unspecified, uncomplicated: Secondary | ICD-10-CM | POA: Insufficient documentation

## 2011-09-23 LAB — URINALYSIS, ROUTINE W REFLEX MICROSCOPIC
Ketones, ur: 15 mg/dL — AB
Protein, ur: >300 mg/dL — AB
Specific Gravity, Urine: 1.041 — ABNORMAL HIGH (ref 1.005–1.030)
Urobilinogen, UA: 0.2 mg/dL (ref 0.0–1.0)

## 2011-09-23 LAB — DIFFERENTIAL
Basophils Absolute: 0 10*3/uL (ref 0.0–0.1)
Lymphocytes Relative: 28 % (ref 12–46)
Lymphs Abs: 4.9 10*3/uL — ABNORMAL HIGH (ref 0.7–4.0)
Neutro Abs: 11.2 10*3/uL — ABNORMAL HIGH (ref 1.7–7.7)

## 2011-09-23 LAB — CBC
HCT: 45.2 % (ref 36.0–46.0)
Hemoglobin: 16.1 g/dL — ABNORMAL HIGH (ref 12.0–15.0)
MCH: 31.6 pg (ref 26.0–34.0)
MCHC: 35.6 g/dL (ref 30.0–36.0)

## 2011-09-23 LAB — COMPREHENSIVE METABOLIC PANEL
ALT: 14 U/L (ref 0–35)
AST: 20 U/L (ref 0–37)
Alkaline Phosphatase: 100 U/L (ref 39–117)
CO2: 28 mEq/L (ref 19–32)
Chloride: 91 mEq/L — ABNORMAL LOW (ref 96–112)
GFR calc non Af Amer: 40 mL/min — ABNORMAL LOW (ref >90)
Glucose, Bld: 156 mg/dL — ABNORMAL HIGH (ref 70–99)
Potassium: 3 mEq/L — ABNORMAL LOW (ref 3.5–5.1)
Sodium: 137 mEq/L (ref 135–145)
Total Bilirubin: 0.7 mg/dL (ref 0.3–1.2)

## 2011-09-23 LAB — URINE MICROSCOPIC-ADD ON

## 2011-09-23 MED ORDER — SODIUM CHLORIDE 0.9 % IV SOLN
1000.0000 mL | Freq: Once | INTRAVENOUS | Status: AC
  Administered 2011-09-23: 1000 mL via INTRAVENOUS

## 2011-09-23 MED ORDER — METOCLOPRAMIDE HCL 10 MG PO TABS: 10.0000 mg | ORAL_TABLET | Freq: Four times a day (QID) | ORAL | Status: DC | PRN

## 2011-09-23 MED ORDER — METOCLOPRAMIDE HCL 5 MG/ML IJ SOLN
10.0000 mg | Freq: Once | INTRAMUSCULAR | Status: AC
  Administered 2011-09-23: 10 mg via INTRAVENOUS
  Filled 2011-09-23: qty 2

## 2011-09-23 MED ORDER — DIPHENHYDRAMINE HCL 50 MG/ML IJ SOLN
12.5000 mg | Freq: Once | INTRAMUSCULAR | Status: AC
  Administered 2011-09-23: 12.5 mg via INTRAVENOUS
  Filled 2011-09-23: qty 1

## 2011-09-23 MED ORDER — POTASSIUM CHLORIDE CRYS ER 20 MEQ PO TBCR
40.0000 meq | EXTENDED_RELEASE_TABLET | Freq: Once | ORAL | Status: AC
  Administered 2011-09-23: 40 meq via ORAL
  Filled 2011-09-23: qty 2

## 2011-09-23 MED ORDER — SODIUM CHLORIDE 0.9 % IV SOLN: 1000.0000 mL | INTRAVENOUS | Status: DC

## 2011-09-23 NOTE — ED Notes (Signed)
Pt is resting quitely in bed.

## 2011-09-23 NOTE — ED Provider Notes (Signed)
History     CSN: [id number] Arrival date & time 09/23/11  0712 First MD Initiated Contact with Patient 09/23/11 [phone]    Chief Complaint  Patient presents with  . Nausea  . Emesis    Patient is a 46 y.o. female presenting with vomiting. The history is provided by the patient.  Emesis  This is a recurrent problem. The current episode started 2 days ago. The problem occurs more than 10 times per day. The problem has not changed since onset.The emesis has an appearance of stomach contents. There has been no fever. Pertinent negatives include no abdominal pain, no chills, no diarrhea and no fever.  Pt has had this trouble before.  She has seen Dr. Loreta Ave and has had a workup including an evaluation of her gallbladder.  Pt notes that she seems to have trouble with her menses when she has these spells.  Pt has been given prescriptions before but she has not taken it this time.  It doesn't always help.  Pt has had to come to the ED several times in the past.  IV fluids and reglan usually help.  Past Medical History  Diagnosis Date  . Reflux     History reviewed. No pertinent past surgical history. except myomectomy of uterus  No family history on file.  History  Substance Use Topics  . Smoking status: Current Everyday Smoker    Types: Cigarettes  . Smokeless tobacco: Not on file  . Alcohol Use: No    OB History    Grav Para Term Preterm Abortions TAB SAB Ect Mult Living                  Review of Systems  Constitutional: Negative for fever and chills.  Gastrointestinal: Positive for vomiting. Negative for abdominal pain and diarrhea.    Allergies  Penicillins  Home Medications   Current Outpatient Rx  Name Route Sig Dispense Refill  . POTASSIUM CHLORIDE ER 10 MEQ PO TBCR Oral Take 2 tablets (20 mEq total) by mouth 2 (two) times daily. 6 tablet 0    BP 117/77  Pulse 108  Temp(Src) 98.3 F (36.8 C) (Oral)  Resp 18  SpO2 98%  Physical Exam  Nursing note and vitals  reviewed. Constitutional: She appears well-developed and well-nourished. She appears distressed.  HENT:  Head: Normocephalic and atraumatic.  Right Ear: External ear normal.  Left Ear: External ear normal.  Mouth/Throat: No oropharyngeal exudate (dry mucous membranes).  Eyes: Conjunctivae are normal. Right eye exhibits no discharge. Left eye exhibits no discharge. No scleral icterus.  Neck: Neck supple. No tracheal deviation present.  Cardiovascular: Regular rhythm and intact distal pulses.  Tachycardia present.   Pulmonary/Chest: Effort normal and breath sounds normal. No stridor. No respiratory distress. She has no wheezes. She has no rales.  Abdominal: Soft. Bowel sounds are normal. She exhibits no distension and no mass. There is no tenderness. There is no rebound and no guarding.  Musculoskeletal: She exhibits no edema and no tenderness.  Neurological: She is alert. She has normal strength. No sensory deficit. Cranial nerve deficit:  no gross defecits noted. She exhibits normal muscle tone. She displays no seizure activity. Coordination normal.  Skin: Skin is warm and dry. No rash noted.  Psychiatric: She has a normal mood and affect.    ED Course  Procedures (including critical care time) 7:41 AM Discussed treatment.  Pt does not want x-rays.  They never are abnormal.  Medications  0.9 %  sodium  chloride infusion (1000 mL Intravenous New Bag/Given 09/23/11 0802)    Followed by  0.9 %  sodium chloride infusion (not administered)    Followed by  0.9 %  sodium chloride infusion (not administered)  metoCLOPramide (REGLAN) injection 10 mg (10 mg Intravenous Given 09/23/11 0802)  diphenhydrAMINE (BENADRYL) injection 12.5 mg (12.5 mg Intravenous Given 09/23/11 0804)    Labs Reviewed  CBC - Abnormal; Notable for the following:    WBC 17.9 (*)    Hemoglobin 16.1 (*)    All other components within normal limits  DIFFERENTIAL - Abnormal; Notable for the following:    Neutro Abs 11.2 (*)      Lymphs Abs 4.9 (*)    Monocytes Absolute 1.7 (*)    All other components within normal limits  COMPREHENSIVE METABOLIC PANEL - Abnormal; Notable for the following:    Potassium 3.0 (*)    Chloride 91 (*)    Glucose, Bld 156 (*)    BUN 29 (*)    Creatinine, Ser 1.52 (*)    Calcium 11.6 (*)    Total Protein 10.1 (*)    Albumin 5.3 (*)    GFR calc non Af Amer 40 (*)    GFR calc Af Amer 46 (*)    All other components within normal limits  URINALYSIS, ROUTINE W REFLEX MICROSCOPIC - Abnormal; Notable for the following:    Color, Urine AMBER (*) BIOCHEMICALS MAY BE AFFECTED BY COLOR   APPearance CLOUDY (*)    Specific Gravity, Urine 1.041 (*)    Glucose, UA 100 (*)    Hgb urine dipstick TRACE (*)    Bilirubin Urine SMALL (*)    Ketones, ur 15 (*)    Protein, ur >300 (*)    All other components within normal limits  LIPASE, BLOOD - Abnormal; Notable for the following:    Lipase 10 (*)    All other components within normal limits  URINE MICROSCOPIC-ADD ON - Abnormal; Notable for the following:    Squamous Epithelial / LPF MANY (*)    Bacteria, UA FEW (*)    Casts HYALINE CASTS (*) GRANULAR CAST   All other components within normal limits   No results found.   1. Dehydration   2. Hypokalemia       MDM  Labs consistent with dehydration, increased bun and creatinine, urine SG increased.  Leukocytosis noted but may be due to demargination.    Doubt bowel obstruction.  She has been tolerating po fluids and is feeling better.  Pt has a history of recurrent episodes of nausea and vomiting similar to this.  She does not want any imaging at this time and feels like this is similar to her prior episodes.  Pt has been given oral fluids, antiemetics and potassium replacement.  Pt comfortable with discharge.  Instructed her to return to the ED for worsening symptoms.  She is to follow up with her PCP or ED this week if not better.        Celene Kras, MD 09/23/11 [phone]

## 2011-09-23 NOTE — ED Notes (Signed)
Pt in from home with c/o n/v since Friday denies pain

## 2011-09-23 NOTE — ED Notes (Signed)
Pt states she feels dehydrated and wants medication and an IV with fluids. Pt stated she has been going through menopause and thinks she's about to start her menstrual cycle, which comes about every 3 months. Has had nausea and vomiting problems in the past.

## 2011-09-23 NOTE — Discharge Instructions (Signed)
Dehydration, Adult Dehydration is when you lose more fluids from the body than you take in. Vital organs like the kidneys, brain, and heart cannot function without a proper amount of fluids and salt. Any loss of fluids from the body can cause dehydration.  CAUSES   Vomiting.   Diarrhea.   Excessive sweating.   Excessive urine output.   Fever.  SYMPTOMS  Mild dehydration  Thirst.   Dry lips.   Slightly dry mouth.  Moderate dehydration  Very dry mouth.   Sunken eyes.   Skin does not bounce back quickly when lightly pinched and released.   Dark urine and decreased urine production.   Decreased tear production.   Headache.  Severe dehydration  Very dry mouth.   Extreme thirst.   Rapid, weak pulse (more than 100 beats per minute at rest).   Cold hands and feet.   Not able to sweat in spite of heat and temperature.   Rapid breathing.   Blue lips.   Confusion and lethargy.   Difficulty being awakened.   Minimal urine production.   No tears.  DIAGNOSIS  Your caregiver will diagnose dehydration based on your symptoms and your exam. Blood and urine tests will help confirm the diagnosis. The diagnostic evaluation should also identify the cause of dehydration. TREATMENT  Treatment of mild or moderate dehydration can often be done at home by increasing the amount of fluids that you drink. It is best to drink small amounts of fluid more often. Drinking too much at one time can make vomiting worse. Refer to the home care instructions below. Severe dehydration needs to be treated at the hospital where you will probably be given intravenous (IV) fluids that contain water and electrolytes. HOME CARE INSTRUCTIONS   Ask your caregiver about specific rehydration instructions.   Drink enough fluids to keep your urine clear or pale yellow.   Drink small amounts frequently if you have nausea and vomiting.   Eat as you normally do.   Avoid:   Foods or drinks high in  sugar.   Carbonated drinks.   Juice.   Extremely hot or cold fluids.   Drinks with caffeine.   Fatty, greasy foods.   Alcohol.   Tobacco.   Overeating.   Gelatin desserts.   Wash your hands well to avoid spreading bacteria and viruses.   Only take over-the-counter or prescription medicines for pain, discomfort, or fever as directed by your caregiver.   Ask your caregiver if you should continue all prescribed and over-the-counter medicines.   Keep all follow-up appointments with your caregiver.  SEEK MEDICAL CARE IF:  You have abdominal pain and it increases or stays in one area (localizes).   You have a rash, stiff neck, or severe headache.   You are irritable, sleepy, or difficult to awaken.   You are weak, dizzy, or extremely thirsty.  SEEK IMMEDIATE MEDICAL CARE IF:   You are unable to keep fluids down or you get worse despite treatment.   You have frequent episodes of vomiting or diarrhea.   You have blood or green matter (bile) in your vomit.   You have blood in your stool or your stool looks black and tarry.   You have not urinated in 6 to 8 hours, or you have only urinated a small amount of very dark urine.   You have a fever.   You faint.  MAKE SURE YOU:   Understand these instructions.   Will watch your condition.     Will get help right away if you are not doing well or get worse.  Document Released: 05/07/2005 Document Revised: 04/26/2011 Document Reviewed: 12/25/2010 [facility] Patient Information 2012 [place].Hypokalemia Hypokalemia means a low potassium level in the blood.Potassium is an electrolyte that helps regulate the amount of fluid in the body. It also stimulates muscle contraction and maintains a stable acid-base balance.Most of the body's potassium is inside of cells, and only a very small amount is in the blood. Because the amount in the blood is so small, minor changes can have big effects. PREPARATION FOR TEST Testing for  potassium requires taking a blood sample taken by needle from a vein in the arm. The skin is cleaned thoroughly before the sample is drawn. There is no other special preparation needed. NORMAL VALUES Potassium levels below 3.5 mEq/L are abnormally low. Levels above 5.1 mEq/L are abnormally high. Ranges for normal findings may vary among different laboratories and hospitals. You should always check with your doctor after having lab work or other tests done to discuss the meaning of your test results and whether your values are considered within normal limits. MEANING OF TEST  Your caregiver will go over the test results with you and discuss the importance and meaning of your results, as well as treatment options and the need for additional tests, if necessary. A potassium level is frequently part of a routine medical exam. It is usually included as part of a whole "panel" of tests for several blood salts (such as Sodium and Chloride). It may be done as part of follow-up when a low potassium level was found in the past or other blood salts are suspected of being out of balance. A low potassium level might be suspected if you have one or more of the following:  Symptoms of weakness.   Abnormal heart rhythms.   High blood pressure and are taking medication to control this, especially water pills (diuretics).   Kidney disease that can affect your potassium level .   Diabetes requiring the use of insulin. The potassium may fall after taking insulin, especially if the diabetes had been out of control for a while.   A condition requiring the use of cortisone-type medication or certain types of antibiotics.   Vomiting and/or diarrhea for more than a day or two.   A stomach or intestinal condition that may not permit appropriate absorption of potassium.   Fainting episodes.   Mental confusion.  OBTAINING TEST RESULTS It is your responsibility to obtain your test results. Ask the lab or department  performing the test when and how you will get your results.  Please contact your caregiver directly if you have not received the results within one week. At that time, ask if there is anything different or new you should be doing in relation to the results. TREATMENT Hypokalemia can be treated with potassium supplements taken by mouth and/or adjustments in your current medications. A diet high in potassium is also helpful. Foods with high potassium content are:  Peas, lentils, lima beans, nuts, and dried fruit.   Whole grain and bran cereals and breads.   Fresh fruit, vegetables (bananas, cantaloupe, grapefruit, oranges, tomatoes, honeydew melons, potatoes).   Orange and tomato juices.   Meats. If potassium supplement has been prescribed for you today or your medications have been adjusted, see your personal caregiver in time02 for a re-check.  SEEK MEDICAL CARE IF:  There is a feeling of worsening weakness.   You experience repeated chest  palpitations.   You are diabetic and having difficulty keeping your blood sugars in the normal range.   You are experiencing vomiting and/or diarrhea.   You are having difficulty with any of your regular medications.  SEEK IMMEDIATE MEDICAL CARE IF:  You experience chest pain, shortness of breath, or episodes of dizziness.   You have been having vomiting or diarrhea for more than 2 days.   You have a fainting episode.  MAKE SURE YOU:   Understand these instructions.   Will watch your condition.   Will get help right away if you are not doing well or get worse.  Document Released: 05/07/2005 Document Revised: 04/26/2011 Document Reviewed: 04/17/2008 [facility] Patient Information 2012 [place], [place].Nausea and Vomiting Nausea is a sick feeling that often comes before throwing up (vomiting). Vomiting is a reflex where stomach contents come out of your mouth. Vomiting can cause severe loss of body fluids (dehydration). Children and elderly  adults can become dehydrated quickly, especially if they also have diarrhea. Nausea and vomiting are symptoms of a condition or disease. It is important to find the cause of your symptoms. CAUSES   Direct irritation of the stomach lining. This irritation can result from increased acid production (gastroesophageal reflux disease), infection, food poisoning, taking certain medicines (such as nonsteroidal anti-inflammatory drugs), alcohol use, or tobacco use.   Signals from the brain.These signals could be caused by a headache, heat exposure, an inner ear disturbance, increased pressure in the brain from injury, infection, a tumor, or a concussion, pain, emotional stimulus, or metabolic problems.   An obstruction in the gastrointestinal tract (bowel obstruction).   Illnesses such as diabetes, hepatitis, gallbladder problems, appendicitis, kidney problems, cancer, sepsis, atypical symptoms of a heart attack, or eating disorders.   Medical treatments such as chemotherapy and radiation.   Receiving medicine that makes you sleep (general anesthetic) during surgery.  DIAGNOSIS Your caregiver may ask for tests to be done if the problems do not improve after a few days. Tests may also be done if symptoms are severe or if the reason for the nausea and vomiting is not clear. Tests may include:  Urine tests.   Blood tests.   Stool tests.   Cultures (to look for evidence of infection).   X-rays or other imaging studies.  Test results can help your caregiver make decisions about treatment or the need for additional tests. TREATMENT You need to stay well hydrated. Drink frequently but in small amounts.You may wish to drink water, sports drinks, clear broth, or eat frozen ice pops or gelatin dessert to help stay hydrated.When you eat, eating slowly may help prevent nausea.There are also some antinausea medicines that may help prevent nausea. HOME CARE INSTRUCTIONS   Take all medicine as directed by  your caregiver.   If you do not have an appetite, do not force yourself to eat. However, you must continue to drink fluids.   If you have an appetite, eat a normal diet unless your caregiver tells you differently.   Eat a variety of complex carbohydrates (rice, wheat, potatoes, bread), lean meats, yogurt, fruits, and vegetables.   Avoid high-fat foods because they are more difficult to digest.   Drink enough water and fluids to keep your urine clear or pale yellow.   If you are dehydrated, ask your caregiver for specific rehydration instructions. Signs of dehydration may include:   Severe thirst.   Dry lips and mouth.   Dizziness.   Dark urine.   Decreasing urine frequency  and amount.   Confusion.   Rapid breathing or pulse.  SEEK IMMEDIATE MEDICAL CARE IF:   You have blood or brown flecks (like coffee grounds) in your vomit.   You have black or bloody stools.   You have a severe headache or stiff neck.   You are confused.   You have severe abdominal pain.   You have chest pain or trouble breathing.   You do not urinate at least once every 8 hours.   You develop cold or clammy skin.   You continue to vomit for longer than 24 to 48 hours.   You have a fever.  MAKE SURE YOU:   Understand these instructions.   Will watch your condition.   Will get help right away if you are not doing well or get worse.  Document Released: 05/07/2005 Document Revised: 04/26/2011 Document Reviewed: 10/04/2010 [facility] Patient Information 2012 [place], [place].

## 2012-04-26 ENCOUNTER — Encounter (HOSPITAL_COMMUNITY): Payer: Self-pay | Admitting: *Deleted

## 2012-04-26 ENCOUNTER — Emergency Department (HOSPITAL_COMMUNITY)
Admission: EM | Admit: 2012-04-26 | Discharge: 2012-04-26 | Disposition: A | Discharge status: Home / Self Care | Payer: 59 | Attending: Emergency Medicine | Admitting: Emergency Medicine

## 2012-04-26 DIAGNOSIS — Z9889 Other specified postprocedural states: Secondary | ICD-10-CM | POA: Insufficient documentation

## 2012-04-26 DIAGNOSIS — R112 Nausea with vomiting, unspecified: Secondary | ICD-10-CM

## 2012-04-26 DIAGNOSIS — K219 Gastro-esophageal reflux disease without esophagitis: Secondary | ICD-10-CM | POA: Insufficient documentation

## 2012-04-26 DIAGNOSIS — K3184 Gastroparesis: Secondary | ICD-10-CM

## 2012-04-26 DIAGNOSIS — F17200 Nicotine dependence, unspecified, uncomplicated: Secondary | ICD-10-CM | POA: Insufficient documentation

## 2012-04-26 LAB — CBC
MCV: 87.3 fL (ref 78.0–100.0)
Platelets: 286 10*3/uL (ref 150–400)
RDW: 13.2 % (ref 11.5–15.5)
WBC: 15.9 10*3/uL — ABNORMAL HIGH (ref 4.0–10.5)

## 2012-04-26 LAB — BASIC METABOLIC PANEL
Calcium: 10.9 mg/dL — ABNORMAL HIGH (ref 8.4–10.5)
Chloride: 87 mEq/L — ABNORMAL LOW (ref 96–112)
Creatinine, Ser: 2.05 mg/dL — ABNORMAL HIGH (ref 0.50–1.10)
GFR calc Af Amer: 32 mL/min — ABNORMAL LOW (ref >90)

## 2012-04-26 MED ORDER — POTASSIUM CHLORIDE CRYS ER 20 MEQ PO TBCR
20.0000 meq | EXTENDED_RELEASE_TABLET | Freq: Once | ORAL | Status: AC
  Administered 2012-04-26: 20 meq via ORAL
  Filled 2012-04-26: qty 1

## 2012-04-26 MED ORDER — DIPHENHYDRAMINE HCL 50 MG/ML IJ SOLN
12.5000 mg | Freq: Once | INTRAMUSCULAR | Status: AC
  Administered 2012-04-26: 12.5 mg via INTRAVENOUS
  Filled 2012-04-26: qty 1

## 2012-04-26 MED ORDER — SODIUM CHLORIDE 0.9 % IV SOLN: 1000.0000 mL | INTRAVENOUS | Status: DC

## 2012-04-26 MED ORDER — METOCLOPRAMIDE HCL 10 MG PO TABS: 10.0000 mg | ORAL_TABLET | Freq: Four times a day (QID) | ORAL | Status: DC | PRN

## 2012-04-26 MED ORDER — ONDANSETRON HCL 4 MG/2ML IJ SOLN
4.0000 mg | Freq: Once | INTRAMUSCULAR | Status: AC
  Administered 2012-04-26: 4 mg via INTRAVENOUS
  Filled 2012-04-26: qty 2

## 2012-04-26 MED ORDER — GI COCKTAIL ~~LOC~~
30.0000 mL | Freq: Once | ORAL | Status: AC
  Administered 2012-04-26: 30 mL via ORAL
  Filled 2012-04-26: qty 30

## 2012-04-26 MED ORDER — SODIUM CHLORIDE 0.9 % IV SOLN
1000.0000 mL | Freq: Once | INTRAVENOUS | Status: AC
  Administered 2012-04-26: 1000 mL via INTRAVENOUS

## 2012-04-26 MED ORDER — METOCLOPRAMIDE HCL 5 MG/ML IJ SOLN
10.0000 mg | Freq: Once | INTRAMUSCULAR | Status: AC
  Administered 2012-04-26: 10 mg via INTRAVENOUS
  Filled 2012-04-26: qty 2

## 2012-04-26 MED ORDER — ONDANSETRON 8 MG PO TBDP: 8.0000 mg | ORAL_TABLET | Freq: Three times a day (TID) | ORAL | Status: DC | PRN

## 2012-04-26 MED ORDER — POTASSIUM CHLORIDE ER 10 MEQ PO TBCR: 10.0000 meq | EXTENDED_RELEASE_TABLET | Freq: Two times a day (BID) | ORAL | Status: DC

## 2012-04-26 NOTE — ED Notes (Signed)
Pt reports n/v since Thursday night. Has been seen here for same before. Sts "my gallbladder functions at 1%, I think those pork chops did it." Pt sts "I think I need a shot of reglan to keep it down."

## 2012-04-26 NOTE — ED Provider Notes (Signed)
History     CSN: [id number]  Arrival date & time 04/26/12  4098   First MD Initiated Contact with Patient 04/26/12 [phone]      Chief Complaint  Patient presents with  . Nausea  . Emesis     The history is provided by the patient and medical records.   patient reports a history of gastroparesis and she has had nausea and vomiting over the past 36 hours.  Denies diarrhea.  No hematemesis.  No fevers or chills.  She reports usually Reglan fluids and Benadryl in the emergency department helps her gastroparesis.  She has no antinausea medicine at home.  Nothing worsens or improves her symptoms.  She denies significant abdominal discomfort.  No flank pain.  No urinary symptoms.  She reports mild decreased oral intake over the past 36 hours.  Past Medical History  Diagnosis Date  . Reflux     Past Surgical History  Procedure Date  . Myomectomy     No family history on file.  History  Substance Use Topics  . Smoking status: Current Every Day Smoker    Types: Cigarettes  . Smokeless tobacco: Not on file  . Alcohol Use: No    OB History    Grav Para Term Preterm Abortions TAB SAB Ect Mult Living                  Review of Systems  Gastrointestinal: Positive for vomiting.  All other systems reviewed and are negative.    Allergies  Penicillins  Home Medications   Current Outpatient Rx  Name  Route  Sig  Dispense  Refill  . METOCLOPRAMIDE HCL 10 MG PO TABS   Oral   Take 1 tablet (10 mg total) by mouth every 6 (six) hours as needed.   12 tablet   0   . ONDANSETRON 8 MG PO TBDP   Oral   Take 1 tablet (8 mg total) by mouth every 8 (eight) hours as needed for nausea.   12 tablet   0     BP 105/85  Pulse 108  Temp 98.8 F (37.1 C) (Oral)  Resp 18  Ht 5\' 9"  (1.753 m)  Wt 180 lb (81.647 kg)  BMI 26.58 kg/m2  SpO2 96%  LMP 03/27/2012  Physical Exam  Nursing note and vitals reviewed. Constitutional: She is oriented to person, place, and time. She appears  well-developed and well-nourished. No distress.  HENT:  Head: Normocephalic and atraumatic.  Eyes: EOM are normal.  Neck: Normal range of motion.  Cardiovascular: Normal rate, regular rhythm and normal heart sounds.   Pulmonary/Chest: Effort normal and breath sounds normal.  Abdominal: Soft. She exhibits no distension and no mass. There is no tenderness. There is no rebound and no guarding.  Musculoskeletal: Normal range of motion.  Neurological: She is alert and oriented to person, place, and time.  Skin: Skin is warm and dry.  Psychiatric: She has a normal mood and affect. Judgment normal.    ED Course  Procedures (including critical care time)  Labs Reviewed  CBC - Abnormal; Notable for the following:    WBC 15.9 (*)     RBC 5.21 (*)     Hemoglobin 16.1 (*)     All other components within normal limits  BASIC METABOLIC PANEL - Abnormal; Notable for the following:    Potassium 2.9 (*)     Chloride 87 (*)     Glucose, Bld 139 (*)     BUN  35 (*)     Creatinine, Ser 2.05 (*)     Calcium 10.9 (*)     GFR calc non Af Amer 28 (*)     GFR calc Af Amer 32 (*)     All other components within normal limits   No results found.   1. Nausea & vomiting   2. Gastroparesis       MDM  11:43 AM The patient feels much better in the emergency department.  She's keeping oral fluids down.  Abdominal exam is without any upper abdominal tenderness.  White count is noted to be 16,000 and her potassium is low at 2.9.  Her white count is consistently elevated over the past several years of presentation to the emergency department.  Her potassium will be replaced as an outpatient.  She does have a mild elevation in her BUN and creatinine as compared to baseline however this is likely all prerenal and should improve now she's keeping oral fluids down.  Instructed the patient to return to the ER for new or worsening symptoms.  I do not think she needs to be hospitalized at this time.  The patient  understands to the weekend at the emergency department his place return to the      Lyanne Co, MD 04/26/12 1147

## 2012-05-30 ENCOUNTER — Emergency Department (HOSPITAL_COMMUNITY)
Admission: EM | Admit: 2012-05-30 | Discharge: 2012-05-30 | Disposition: A | Discharge status: Home / Self Care | Payer: 59 | Attending: Emergency Medicine | Admitting: Emergency Medicine

## 2012-05-30 ENCOUNTER — Encounter (HOSPITAL_COMMUNITY): Payer: Self-pay | Admitting: Emergency Medicine

## 2012-05-30 DIAGNOSIS — R112 Nausea with vomiting, unspecified: Secondary | ICD-10-CM | POA: Insufficient documentation

## 2012-05-30 DIAGNOSIS — R1013 Epigastric pain: Secondary | ICD-10-CM | POA: Insufficient documentation

## 2012-05-30 DIAGNOSIS — Z8719 Personal history of other diseases of the digestive system: Secondary | ICD-10-CM | POA: Insufficient documentation

## 2012-05-30 DIAGNOSIS — Z3202 Encounter for pregnancy test, result negative: Secondary | ICD-10-CM | POA: Insufficient documentation

## 2012-05-30 DIAGNOSIS — E860 Dehydration: Secondary | ICD-10-CM

## 2012-05-30 DIAGNOSIS — R1110 Vomiting, unspecified: Secondary | ICD-10-CM

## 2012-05-30 DIAGNOSIS — F17200 Nicotine dependence, unspecified, uncomplicated: Secondary | ICD-10-CM | POA: Insufficient documentation

## 2012-05-30 LAB — URINALYSIS, MICROSCOPIC ONLY
Glucose, UA: NEGATIVE mg/dL
Protein, ur: >300 mg/dL — AB

## 2012-05-30 LAB — CBC WITH DIFFERENTIAL/PLATELET
Basophils Absolute: 0 10*3/uL (ref 0.0–0.1)
Lymphocytes Relative: 18 % (ref 12–46)
Neutro Abs: 6.5 10*3/uL (ref 1.7–7.7)
Platelets: 228 10*3/uL (ref 150–400)
RDW: 13.2 % (ref 11.5–15.5)
WBC: 9.4 10*3/uL (ref 4.0–10.5)

## 2012-05-30 LAB — COMPREHENSIVE METABOLIC PANEL
ALT: 21 U/L (ref 0–35)
AST: 21 U/L (ref 0–37)
CO2: 26 mEq/L (ref 19–32)
Chloride: 94 mEq/L — ABNORMAL LOW (ref 96–112)
GFR calc non Af Amer: >90 mL/min (ref >90)
Potassium: 3.1 mEq/L — ABNORMAL LOW (ref 3.5–5.1)
Sodium: 135 mEq/L (ref 135–145)
Total Bilirubin: 0.3 mg/dL (ref 0.3–1.2)

## 2012-05-30 MED ORDER — METOCLOPRAMIDE HCL 10 MG PO TABS: 10.0000 mg | ORAL_TABLET | Freq: Four times a day (QID) | ORAL | Status: DC | PRN

## 2012-05-30 MED ORDER — POTASSIUM CHLORIDE 10 MEQ/100ML IV SOLN
10.0000 meq | Freq: Once | INTRAVENOUS | Status: AC
  Administered 2012-05-30: 10 meq via INTRAVENOUS
  Filled 2012-05-30: qty 100

## 2012-05-30 MED ORDER — FAMOTIDINE IN NACL 20-0.9 MG/50ML-% IV SOLN
20.0000 mg | Freq: Once | INTRAVENOUS | Status: AC
  Administered 2012-05-30: 20 mg via INTRAVENOUS
  Filled 2012-05-30: qty 50

## 2012-05-30 MED ORDER — SODIUM CHLORIDE 0.9 % IV BOLUS (SEPSIS)
2000.0000 mL | Freq: Once | INTRAVENOUS | Status: AC
  Administered 2012-05-30: 2000 mL via INTRAVENOUS

## 2012-05-30 MED ORDER — DIPHENHYDRAMINE HCL 50 MG/ML IJ SOLN
25.0000 mg | Freq: Once | INTRAMUSCULAR | Status: AC
  Administered 2012-05-30: 25 mg via INTRAVENOUS
  Filled 2012-05-30: qty 1

## 2012-05-30 MED ORDER — POTASSIUM CHLORIDE CRYS ER 20 MEQ PO TBCR
40.0000 meq | EXTENDED_RELEASE_TABLET | Freq: Once | ORAL | Status: AC
  Administered 2012-05-30: 40 meq via ORAL
  Filled 2012-05-30: qty 2

## 2012-05-30 MED ORDER — METOCLOPRAMIDE HCL 5 MG/ML IJ SOLN
10.0000 mg | Freq: Once | INTRAMUSCULAR | Status: AC
  Administered 2012-05-30: 10 mg via INTRAVENOUS
  Filled 2012-05-30: qty 2

## 2012-05-30 MED ORDER — SODIUM CHLORIDE 0.9 % IV SOLN: 1000.0000 mL | Freq: Once | INTRAVENOUS | Status: DC

## 2012-05-30 MED ORDER — ONDANSETRON HCL 4 MG/2ML IJ SOLN
4.0000 mg | Freq: Once | INTRAMUSCULAR | Status: DC
  Filled 2012-05-30: qty 2

## 2012-05-30 NOTE — ED Provider Notes (Signed)
History     CSN: [id number]  Arrival date & time 05/30/12  8657   First MD Initiated Contact with Patient 05/30/12 [phone]      Chief Complaint  Patient presents with  . Nausea  . Emesis    (Consider location/radiation/quality/duration/timing/severity/associated sxs/prior treatment) The history is provided by the patient.  [name] is a 47 y.o. female hx of reflux and possible gastroparesis here with vomiting and nausea. She stated that she ate some fried chicken 2 days ago and since then she's been vomiting. She has been vomiting about once an hour that is nonbilious nonbloody. She had epigastric pain while she vomits. She denies any diarrhea. She has decreased urine output from dehydration but denies any dysuria or hematuria. She is perimenopausal and feels hot and cold all the time but denies any fever. She has frequent ER visits for gastroparesis or gastroenteritis. She took her reglan but vomited up today.    Past Medical History  Diagnosis Date  . Reflux     Past Surgical History  Procedure Date  . Myomectomy     No family history on file.  History  Substance Use Topics  . Smoking status: Current Every Day Smoker -- 7.0 packs/day    Types: Cigarettes  . Smokeless tobacco: Not on file  . Alcohol Use: No    OB History    Grav Para Term Preterm Abortions TAB SAB Ect Mult Living                  Review of Systems  Gastrointestinal: Positive for nausea, vomiting and abdominal pain.  All other systems reviewed and are negative.    Allergies  Penicillins  Home Medications   No current outpatient prescriptions on file.  BP 176/99  Pulse 93  Temp 99.5 F (37.5 C) (Oral)  Resp 16  SpO2 100%  Physical Exam  Nursing note and vitals reviewed. Constitutional: She is oriented to person, place, and time.       Uncomfortable, crying.   HENT:  Head: Normocephalic.       MM dry   Eyes: Conjunctivae normal are normal. Pupils are equal, round, and  reactive to light.  Neck: Normal range of motion. Neck supple.  Cardiovascular: Normal rate, regular rhythm and normal heart sounds.   Pulmonary/Chest: Effort normal and breath sounds normal. No respiratory distress. She has no wheezes. She has no rales.  Abdominal: Soft.       Mild epigastric tenderness, no rebound   Musculoskeletal: Normal range of motion. She exhibits no edema and no tenderness.  Neurological: She is alert and oriented to person, place, and time.  Skin: Skin is warm and dry.  Psychiatric: She has a normal mood and affect. Her behavior is normal. Judgment and thought content normal.    ED Course  Procedures (including critical care time)  Labs Reviewed  CBC WITH DIFFERENTIAL - Abnormal; Notable for the following:    Hemoglobin 15.1 (*)     Monocytes Relative 13 (*)     Monocytes Absolute 1.2 (*)     All other components within normal limits  COMPREHENSIVE METABOLIC PANEL - Abnormal; Notable for the following:    Potassium 3.1 (*)     Chloride 94 (*)     Glucose, Bld 159 (*)     Calcium 10.8 (*)     Total Protein 9.4 (*)     All other components within normal limits  LIPASE, BLOOD - Abnormal; Notable for the following:  Lipase 7 (*)     All other components within normal limits  URINALYSIS, MICROSCOPIC ONLY - Abnormal; Notable for the following:    Color, Urine AMBER (*)  BIOCHEMICALS MAY BE AFFECTED BY COLOR   APPearance CLOUDY (*)     Specific Gravity, Urine 1.041 (*)     Hgb urine dipstick MODERATE (*)     Bilirubin Urine SMALL (*)     Ketones, ur TRACE (*)     Protein, ur >300 (*)     Bacteria, UA FEW (*)     Squamous Epithelial / LPF FEW (*)     All other components within normal limits  POCT PREGNANCY, URINE  URINE CULTURE   No results found.   No diagnosis found.    MDM  [name] is a 47 y.o. female here with vomiting. Will check labs, lipase, will give IVF, reglan and reassess.   9:10 AM Patient tolerated PO, felt better with  reglan. Labs showed slightly elevated glucose, low K (supplemented). UA contaminated. WBC 15, I think is likely from viral syndrome. I reviewed previous labs and it seems that her glucose is always slightly elevated. I am suspecting that she may have diabetes and this is diabetic gastroparesis. I refilled her reglan and told her to see her pmd and be worked up for gastroparesis.          Richardean Canal, MD 05/30/12 [phone]

## 2012-05-30 NOTE — ED Notes (Signed)
Pt presenting to ed with c/o nausea and vomiting with positive chills no fever. Pt denies abdominal pain at this time. Pt states onset x 2 days

## 2012-05-31 LAB — URINE CULTURE

## 2012-09-03 ENCOUNTER — Emergency Department (HOSPITAL_COMMUNITY)
Admission: EM | Admit: 2012-09-03 | Discharge: 2012-09-03 | Disposition: A | Discharge status: Home / Self Care | Payer: 59 | Attending: Emergency Medicine | Admitting: Emergency Medicine

## 2012-09-03 ENCOUNTER — Encounter (HOSPITAL_COMMUNITY): Payer: Self-pay | Admitting: Emergency Medicine

## 2012-09-03 DIAGNOSIS — Z79899 Other long term (current) drug therapy: Secondary | ICD-10-CM | POA: Insufficient documentation

## 2012-09-03 DIAGNOSIS — K529 Noninfective gastroenteritis and colitis, unspecified: Secondary | ICD-10-CM

## 2012-09-03 DIAGNOSIS — K219 Gastro-esophageal reflux disease without esophagitis: Secondary | ICD-10-CM | POA: Insufficient documentation

## 2012-09-03 DIAGNOSIS — F17200 Nicotine dependence, unspecified, uncomplicated: Secondary | ICD-10-CM | POA: Insufficient documentation

## 2012-09-03 DIAGNOSIS — Z880 Allergy status to penicillin: Secondary | ICD-10-CM | POA: Insufficient documentation

## 2012-09-03 DIAGNOSIS — K5289 Other specified noninfective gastroenteritis and colitis: Secondary | ICD-10-CM | POA: Insufficient documentation

## 2012-09-03 LAB — COMPREHENSIVE METABOLIC PANEL
ALT: 14 U/L (ref 0–35)
BUN: 14 mg/dL (ref 6–23)
CO2: 26 mEq/L (ref 19–32)
Calcium: 11 mg/dL — ABNORMAL HIGH (ref 8.4–10.5)
Creatinine, Ser: 0.88 mg/dL (ref 0.50–1.10)
GFR calc Af Amer: 90 mL/min — ABNORMAL LOW (ref >90)
GFR calc non Af Amer: 78 mL/min — ABNORMAL LOW (ref >90)
Glucose, Bld: 154 mg/dL — ABNORMAL HIGH (ref 70–99)

## 2012-09-03 LAB — CBC WITH DIFFERENTIAL/PLATELET
Eosinophils Relative: 0 % (ref 0–5)
HCT: 43.2 % (ref 36.0–46.0)
Lymphocytes Relative: 22 % (ref 12–46)
Lymphs Abs: 3 10*3/uL (ref 0.7–4.0)
MCV: 86.9 fL (ref 78.0–100.0)
Monocytes Absolute: 1.2 10*3/uL — ABNORMAL HIGH (ref 0.1–1.0)
Monocytes Relative: 9 % (ref 3–12)
RBC: 4.97 MIL/uL (ref 3.87–5.11)
WBC: 13.3 10*3/uL — ABNORMAL HIGH (ref 4.0–10.5)

## 2012-09-03 MED ORDER — SODIUM CHLORIDE 0.9 % IV BOLUS (SEPSIS)
1000.0000 mL | Freq: Once | INTRAVENOUS | Status: AC
  Administered 2012-09-03: 1000 mL via INTRAVENOUS

## 2012-09-03 MED ORDER — PANTOPRAZOLE SODIUM 40 MG IV SOLR
40.0000 mg | Freq: Once | INTRAVENOUS | Status: AC
  Administered 2012-09-03: 40 mg via INTRAVENOUS
  Filled 2012-09-03: qty 40

## 2012-09-03 MED ORDER — ONDANSETRON HCL 4 MG/2ML IJ SOLN
4.0000 mg | Freq: Once | INTRAMUSCULAR | Status: AC
  Administered 2012-09-03: 4 mg via INTRAVENOUS
  Filled 2012-09-03: qty 2

## 2012-09-03 MED ORDER — PANTOPRAZOLE SODIUM 20 MG PO TBEC: 20.0000 mg | DELAYED_RELEASE_TABLET | Freq: Every day | ORAL | Status: DC

## 2012-09-03 MED ORDER — ONDANSETRON HCL 8 MG PO TABS: 8.0000 mg | ORAL_TABLET | Freq: Three times a day (TID) | ORAL | Status: DC | PRN

## 2012-09-03 MED ORDER — SODIUM CHLORIDE 0.9 % IV SOLN
1000.0000 mL | Freq: Once | INTRAVENOUS | Status: AC
  Administered 2012-09-03: 1000 mL via INTRAVENOUS

## 2012-09-03 NOTE — ED Provider Notes (Signed)
History     CSN: [id number]  Arrival date & time 09/03/12  1191   First MD Initiated Contact with Patient 09/03/12 [phone]      Chief Complaint  Patient presents with  . Emesis    (Consider location/radiation/quality/duration/timing/severity/associated sxs/prior treatment) HPI... nausea and vomiting for 2 days, but no diarrhea, fever, chills.  Nothing makes symptoms better or worse. Severity is mild to moderate. No abdominal pain. Unable to tolerate liquids  Past Medical History  Diagnosis Date  . Reflux     Past Surgical History  Procedure Laterality Date  . Myomectomy      History reviewed. No pertinent family history.  History  Substance Use Topics  . Smoking status: Current Every Day Smoker -- 0.33 packs/day    Types: Cigarettes  . Smokeless tobacco: Not on file  . Alcohol Use: No    OB History   Grav Para Term Preterm Abortions TAB SAB Ect Mult Living                  Review of Systems  All other systems reviewed and are negative.    Allergies  Penicillins  Home Medications   Current Outpatient Rx  Name  Route  Sig  Dispense  Refill  . metoCLOPramide (REGLAN) 10 MG tablet   Oral   Take 1 tablet (10 mg total) by mouth every 6 (six) hours as needed (nausea/headache).   15 tablet   0   . ondansetron (ZOFRAN) 8 MG tablet   Oral   Take 1 tablet (8 mg total) by mouth every 8 (eight) hours as needed for nausea.   15 tablet   1   . pantoprazole (PROTONIX) 20 MG tablet   Oral   Take 1 tablet (20 mg total) by mouth daily.   30 tablet   1     BP 169/94  Pulse 71  Temp(Src) 98.8 F (37.1 C) (Oral)  Resp 20  Ht 5\' 9"  (1.753 m)  Wt 180 lb (81.647 kg)  BMI 26.57 kg/m2  SpO2 100%  Physical Exam  Nursing note and vitals reviewed. Constitutional: She is oriented to person, place, and time. She appears well-developed and well-nourished.  HENT:  Head: Normocephalic and atraumatic.  Eyes: Conjunctivae and EOM are normal. Pupils are equal, round,  and reactive to light.  Neck: Normal range of motion. Neck supple.  Cardiovascular: Normal rate, regular rhythm and normal heart sounds.   Pulmonary/Chest: Effort normal and breath sounds normal.  Abdominal: Soft. Bowel sounds are normal.  Musculoskeletal: Normal range of motion.  Neurological: She is alert and oriented to person, place, and time.  Skin: Skin is warm and dry.  Psychiatric: She has a normal mood and affect.    ED Course  Procedures (including critical care time)  Labs Reviewed  CBC WITH DIFFERENTIAL - Abnormal; Notable for the following:    WBC 13.3 (*)    Hemoglobin 15.1 (*)    Neutro Abs 9.1 (*)    Monocytes Absolute 1.2 (*)    All other components within normal limits  COMPREHENSIVE METABOLIC PANEL - Abnormal; Notable for the following:    Potassium 3.4 (*)    Chloride 95 (*)    Glucose, Bld 154 (*)    Calcium 11.0 (*)    Total Protein 9.6 (*)    GFR calc non Af Amer 78 (*)    GFR calc Af Amer 90 (*)    All other components within normal limits  LIPASE, BLOOD - Abnormal;  Notable for the following:    Lipase 10 (*)    All other components within normal limits   No results found.   1. Gastroenteritis       MDM  Patient feels much better after IV hydration, IV Protonix, IV Zofran.  Discharge meds Protonix 20 mg #30/1 and Zofran 8 mg #15/1.  Discussed elevated glucose. No acute abdomen        Donnetta Hutching, MD 09/03/12 1001

## 2012-09-03 NOTE — ED Notes (Signed)
Pt stated that she has been vomiting for the past 2 days, unable to tolerate any food or fluids.  Pt spitting in trash can after ambulating to room.  Pt denies pain.  Pt stated "I have this happen every couple of months and they have to give me Reglan IV."

## 2012-10-15 ENCOUNTER — Encounter (HOSPITAL_COMMUNITY): Payer: Self-pay | Admitting: *Deleted

## 2012-10-15 ENCOUNTER — Emergency Department (HOSPITAL_COMMUNITY)
Admission: EM | Admit: 2012-10-15 | Discharge: 2012-10-15 | Disposition: A | Discharge status: Home / Self Care | Payer: 59 | Attending: Emergency Medicine | Admitting: Emergency Medicine

## 2012-10-15 DIAGNOSIS — R112 Nausea with vomiting, unspecified: Secondary | ICD-10-CM | POA: Insufficient documentation

## 2012-10-15 DIAGNOSIS — Z8719 Personal history of other diseases of the digestive system: Secondary | ICD-10-CM | POA: Insufficient documentation

## 2012-10-15 DIAGNOSIS — F17200 Nicotine dependence, unspecified, uncomplicated: Secondary | ICD-10-CM | POA: Insufficient documentation

## 2012-10-15 DIAGNOSIS — N289 Disorder of kidney and ureter, unspecified: Secondary | ICD-10-CM

## 2012-10-15 DIAGNOSIS — Z880 Allergy status to penicillin: Secondary | ICD-10-CM | POA: Insufficient documentation

## 2012-10-15 LAB — CBC WITH DIFFERENTIAL/PLATELET
Basophils Relative: 0 % (ref 0–1)
Eosinophils Absolute: 0 10*3/uL (ref 0.0–0.7)
Lymphs Abs: 4.4 10*3/uL — ABNORMAL HIGH (ref 0.7–4.0)
MCH: 30.6 pg (ref 26.0–34.0)
Neutrophils Relative %: 62 % (ref 43–77)
Platelets: 307 10*3/uL (ref 150–400)
RBC: 5.13 MIL/uL — ABNORMAL HIGH (ref 3.87–5.11)
WBC: 14.1 10*3/uL — ABNORMAL HIGH (ref 4.0–10.5)

## 2012-10-15 LAB — COMPREHENSIVE METABOLIC PANEL
ALT: 14 U/L (ref 0–35)
AST: 19 U/L (ref 0–37)
Albumin: 4.7 g/dL (ref 3.5–5.2)
Alkaline Phosphatase: 104 U/L (ref 39–117)
Glucose, Bld: 136 mg/dL — ABNORMAL HIGH (ref 70–99)
Potassium: 3.4 mEq/L — ABNORMAL LOW (ref 3.5–5.1)
Sodium: 137 mEq/L (ref 135–145)
Total Protein: 9.8 g/dL — ABNORMAL HIGH (ref 6.0–8.3)

## 2012-10-15 LAB — URINALYSIS, ROUTINE W REFLEX MICROSCOPIC
Glucose, UA: NEGATIVE mg/dL
Specific Gravity, Urine: 1.038 — ABNORMAL HIGH (ref 1.005–1.030)
pH: 5 (ref 5.0–8.0)

## 2012-10-15 MED ORDER — ONDANSETRON HCL 4 MG/2ML IJ SOLN
4.0000 mg | Freq: Once | INTRAMUSCULAR | Status: DC
  Filled 2012-10-15: qty 2

## 2012-10-15 MED ORDER — METOCLOPRAMIDE HCL 5 MG/ML IJ SOLN
10.0000 mg | Freq: Once | INTRAMUSCULAR | Status: AC
  Administered 2012-10-15: 10 mg via INTRAVENOUS
  Filled 2012-10-15: qty 2

## 2012-10-15 MED ORDER — PANTOPRAZOLE SODIUM 40 MG PO TBEC: 40.0000 mg | DELAYED_RELEASE_TABLET | Freq: Once | ORAL | Status: AC

## 2012-10-15 MED ORDER — PROCHLORPERAZINE EDISYLATE 5 MG/ML IJ SOLN
10.0000 mg | Freq: Four times a day (QID) | INTRAMUSCULAR | Status: DC | PRN
  Filled 2012-10-15: qty 2

## 2012-10-15 MED ORDER — METOCLOPRAMIDE HCL 10 MG PO TABS: 10.0000 mg | ORAL_TABLET | Freq: Four times a day (QID) | ORAL | Status: DC | PRN

## 2012-10-15 MED ORDER — SODIUM CHLORIDE 0.9 % IV BOLUS (SEPSIS)
1000.0000 mL | Freq: Once | INTRAVENOUS | Status: AC
  Administered 2012-10-15: 1000 mL via INTRAVENOUS

## 2012-10-15 MED ORDER — LORAZEPAM 2 MG/ML IJ SOLN
1.0000 mg | Freq: Once | INTRAMUSCULAR | Status: DC
  Filled 2012-10-15: qty 1

## 2012-10-15 NOTE — Progress Notes (Signed)
   CARE MANAGEMENT ED NOTE 10/15/2012  Patient:  [name],[name]   Account Number:  [id number]  Date Initiated:  10/15/2012  Documentation initiated by:  Radford Pax  Subjective/Objective Assessment:   Patient came into the ED with nausea and vomitting since Monday     Subjective/Objective Assessment Detail:     Action/Plan:   Action/Plan Detail:   Anticipated DC Date:  10/15/2012     Status Recommendation to Physician:   Result of Recommendation:    Other ED Services  Consult Working Plan    DC Planning Services  Other  PCP issues  Outpatient Services - Pt will follow up    Choice offered to / List presented to:            Status of service:  Completed, signed off  ED Comments:   ED Comments Detail:  Patient listed as not having a PCP.  EDCM spoke to patient. As per patient, Dr. Andi Devon is her PCP.  Patient stated that she will follow up with her PCP after discharge.  Patient also stated that her GYN was Dr. Maxie Better and has had her appointment with her last week.  Also, Dr. Charna Elizabeth is her GI doctor. Patient stated that she has been told by Dr. Loreta Ave and a previous doctor in [place] that her galbladder needed to come out , but she refused to have that surgery done at that time. Encouraged patient to make follow up appintment with Dr. Renae Gloss.  no further needs at this time.

## 2012-10-15 NOTE — ED Notes (Signed)
Pt unable to void at this time. 

## 2012-10-15 NOTE — ED Notes (Signed)
MD at bedside. 

## 2012-10-15 NOTE — ED Provider Notes (Signed)
History     CSN: [id number]  Arrival date & time 10/15/12  4098   First MD Initiated Contact with Patient 10/15/12 [phone]      Chief Complaint  Patient presents with  . Emesis    HPI The patient reports 48 hours of nausea and vomiting without diarrhea.  She reports she's had these issues are currently in a sitting gastroenterologist now and was able to determine the cause.  She reports decreased oral intake over the past 48 hours and decreased urine output yesterday.  She reports her urine is dark in color.  No abdominal pain.  No flank pain.  No urinary frequency or dysuria.  No chest pain or shortness of breath.  She states this feels like her other episodes of isolated nausea and vomiting that usually resolves on its own.  She reports Reglan and Compazine 10 to help her symptoms more than anything.  She states she has a history of gastroesophageal reflux disease but does not take the medications as prescribed by her gastroenterologist consistently, makes her feel.  Her symptoms are mild to moderate in severity.  No hematemesis.  No melena or hematochezia.  She continues to smoke cigarettes.   Past Medical History  Diagnosis Date  . Reflux     Past Surgical History  Procedure Laterality Date  . Myomectomy      No family history on file.  History  Substance Use Topics  . Smoking status: Current Every Day Smoker -- 0.33 packs/day    Types: Cigarettes  . Smokeless tobacco: Not on file  . Alcohol Use: No    OB History   Grav Para Term Preterm Abortions TAB SAB Ect Mult Living                  Review of Systems  All other systems reviewed and are negative.    Allergies  Penicillins  Home Medications   Current Outpatient Rx  Name  Route  Sig  Dispense  Refill  . metoCLOPramide (REGLAN) 10 MG tablet   Oral   Take 1 tablet (10 mg total) by mouth every 6 (six) hours as needed (Nausea).   30 tablet   0     BP 103/83  Pulse 100  Temp(Src) 99 F (37.2 C)  Resp  20  SpO2 99%  Physical Exam  Nursing note and vitals reviewed. Constitutional: She is oriented to person, place, and time. She appears well-developed and well-nourished. No distress.  HENT:  Head: Normocephalic and atraumatic.  Dry mucous membranes  Eyes: EOM are normal.  Neck: Normal range of motion.  Cardiovascular: Normal rate, regular rhythm and normal heart sounds.   Pulmonary/Chest: Effort normal and breath sounds normal.  Abdominal: Soft. She exhibits no distension. There is no tenderness.  Genitourinary:  No CVA tenderness  Musculoskeletal: Normal range of motion.  Neurological: She is alert and oriented to person, place, and time.  Skin: Skin is warm and dry.  Psychiatric: She has a normal mood and affect. Judgment normal.    ED Course  Procedures (including critical care time)  Labs Reviewed  CBC WITH DIFFERENTIAL - Abnormal; Notable for the following:    WBC 14.1 (*)    RBC 5.13 (*)    Hemoglobin 15.7 (*)    Neutro Abs 8.8 (*)    Lymphs Abs 4.4 (*)    All other components within normal limits  COMPREHENSIVE METABOLIC PANEL - Abnormal; Notable for the following:    Potassium 3.4 (*)  Chloride 92 (*)    Glucose, Bld 136 (*)    BUN 37 (*)    Creatinine, Ser 1.49 (*)    Calcium 11.3 (*)    Total Protein 9.8 (*)    GFR calc non Af Amer 41 (*)    GFR calc Af Amer 47 (*)    All other components within normal limits  URINALYSIS, ROUTINE W REFLEX MICROSCOPIC - Abnormal; Notable for the following:    Color, Urine AMBER (*)    APPearance CLOUDY (*)    Specific Gravity, Urine 1.038 (*)    Hgb urine dipstick TRACE (*)    Bilirubin Urine MODERATE (*)    Ketones, ur 15 (*)    Protein, ur 100 (*)    Leukocytes, UA TRACE (*)    All other components within normal limits  URINE MICROSCOPIC-ADD ON - Abnormal; Notable for the following:    Bacteria, UA FEW (*)    Casts HYALINE CASTS (*)    All other components within normal limits  URINE CULTURE  LIPASE, BLOOD    No results found.   1. Nausea & vomiting   2. Renal insufficiency       MDM  Patient does have evidence worsening renal insufficiency.  This is likely prerenal in nature from her nausea and vomiting.  She's now keeping oral fluids down the emergency department.  She feels much better.  She understands the importance of recheck of her kidney function in 48 hours.  She will either follow up with her primary care physician or return to the ER in 48 hours for recheck of her BUN and creatinine.  Have asked that she return the emergency apartment for new or worsening symptoms including inability to keep fluids down.        Lyanne Co, MD 10/15/12 1055

## 2012-10-15 NOTE — ED Notes (Signed)
Pt states she is here to get a shot for her nausea and vomiting; vomiting since Monday am;

## 2012-10-16 LAB — URINE CULTURE

## 2013-01-14 ENCOUNTER — Emergency Department (HOSPITAL_COMMUNITY)
Admission: EM | Admit: 2013-01-14 | Discharge: 2013-01-15 | Disposition: A | Discharge status: Home / Self Care | Payer: Self-pay | Attending: Emergency Medicine | Admitting: Emergency Medicine

## 2013-01-14 ENCOUNTER — Encounter (HOSPITAL_COMMUNITY): Payer: Self-pay | Admitting: Emergency Medicine

## 2013-01-14 DIAGNOSIS — Z880 Allergy status to penicillin: Secondary | ICD-10-CM | POA: Insufficient documentation

## 2013-01-14 DIAGNOSIS — K3184 Gastroparesis: Secondary | ICD-10-CM | POA: Insufficient documentation

## 2013-01-14 DIAGNOSIS — F17200 Nicotine dependence, unspecified, uncomplicated: Secondary | ICD-10-CM | POA: Insufficient documentation

## 2013-01-14 DIAGNOSIS — Z8719 Personal history of other diseases of the digestive system: Secondary | ICD-10-CM | POA: Insufficient documentation

## 2013-01-14 HISTORY — DX: Gastroparesis: K31.84

## 2013-01-14 LAB — CBC WITH DIFFERENTIAL/PLATELET
Basophils Absolute: 0 10*3/uL (ref 0.0–0.1)
Eosinophils Absolute: 0 10*3/uL (ref 0.0–0.7)
Eosinophils Relative: 0 % (ref 0–5)
HCT: 46.3 % — ABNORMAL HIGH (ref 36.0–46.0)
MCH: 30.9 pg (ref 26.0–34.0)
MCHC: 35.2 g/dL (ref 30.0–36.0)
MCV: 87.9 fL (ref 78.0–100.0)
Monocytes Absolute: 1.3 10*3/uL — ABNORMAL HIGH (ref 0.1–1.0)
Platelets: 298 10*3/uL (ref 150–400)
RDW: 13.2 % (ref 11.5–15.5)

## 2013-01-14 MED ORDER — METOCLOPRAMIDE HCL 5 MG/ML IJ SOLN
10.0000 mg | Freq: Once | INTRAMUSCULAR | Status: AC
  Administered 2013-01-14: 10 mg via INTRAVENOUS
  Filled 2013-01-14: qty 2

## 2013-01-14 MED ORDER — ONDANSETRON HCL 4 MG/2ML IJ SOLN
4.0000 mg | Freq: Once | INTRAMUSCULAR | Status: AC
  Administered 2013-01-14: 4 mg via INTRAVENOUS
  Filled 2013-01-14: qty 2

## 2013-01-14 MED ORDER — SODIUM CHLORIDE 0.9 % IV SOLN: 1000.0000 mL | INTRAVENOUS | Status: DC

## 2013-01-14 MED ORDER — SODIUM CHLORIDE 0.9 % IV SOLN
1000.0000 mL | Freq: Once | INTRAVENOUS | Status: AC
  Administered 2013-01-14: 1000 mL via INTRAVENOUS

## 2013-01-14 MED ORDER — GI COCKTAIL ~~LOC~~
30.0000 mL | Freq: Once | ORAL | Status: DC
  Filled 2013-01-14: qty 30

## 2013-01-14 NOTE — ED Notes (Addendum)
Pt c/o emesis x 2 days, 10+ in last 24 hours. Pt has hx of gastroparesis.

## 2013-01-14 NOTE — ED Notes (Signed)
Immediately after triage pt noted to drink almost an entire bottle of water, pt ask repeatedly while drinking to stop drinking.

## 2013-01-15 LAB — COMPREHENSIVE METABOLIC PANEL
ALT: 14 U/L (ref 0–35)
AST: 26 U/L (ref 0–37)
Calcium: 11.3 mg/dL — ABNORMAL HIGH (ref 8.4–10.5)
Creatinine, Ser: 1.32 mg/dL — ABNORMAL HIGH (ref 0.50–1.10)
GFR calc Af Amer: 55 mL/min — ABNORMAL LOW (ref >90)
Sodium: 137 mEq/L (ref 135–145)
Total Protein: 9.8 g/dL — ABNORMAL HIGH (ref 6.0–8.3)

## 2013-01-15 MED ORDER — POTASSIUM CHLORIDE CRYS ER 20 MEQ PO TBCR
40.0000 meq | EXTENDED_RELEASE_TABLET | Freq: Once | ORAL | Status: AC
  Administered 2013-01-15: 40 meq via ORAL
  Filled 2013-01-15: qty 2

## 2013-01-15 MED ORDER — METOCLOPRAMIDE HCL 10 MG PO TABS: 10.0000 mg | ORAL_TABLET | Freq: Four times a day (QID) | ORAL | Status: DC

## 2013-01-15 NOTE — ED Provider Notes (Signed)
CSN: [id number]     Arrival date & time 01/14/13  2242 History   First MD Initiated Contact with Patient 01/14/13 2258     Chief Complaint  Patient presents with  . Emesis   (Consider location/radiation/quality/duration/timing/severity/associated sxs/prior Treatment) HPI Patient reports ongoing vomiting over the past 2 days.  She denies significant abdominal pain at this time.  She has a known history of gastroparesis.  She denies fevers or chills.  No cough or shortness of breath.  No hematemesis.  No melena or hematochezia.  She denies diarrhea.  She's had normal formed stools.  She reports crampy upper abdominal pain.   Past Medical History  Diagnosis Date  . Reflux   . Gastroparesis    Past Surgical History  Procedure Laterality Date  . Myomectomy     No family history on file. History  Substance Use Topics  . Smoking status: Current Every Day Smoker -- 0.33 packs/day    Types: Cigarettes  . Smokeless tobacco: Not on file  . Alcohol Use: No   OB History   Grav Para Term Preterm Abortions TAB SAB Ect Mult Living                 Review of Systems  All other systems reviewed and are negative.    Allergies  Penicillins  Home Medications  No current outpatient prescriptions on file. BP 154/104  Pulse 101  Temp(Src) 99.9 F (37.7 C) (Oral)  Resp 18  Ht 5\' 9"  (1.753 m)  Wt 185 lb (83.915 kg)  BMI 27.31 kg/m2  SpO2 99% Physical Exam  Nursing note and vitals reviewed. Constitutional: She is oriented to person, place, and time. She appears well-developed and well-nourished. No distress.  HENT:  Head: Normocephalic and atraumatic.  Eyes: EOM are normal.  Neck: Normal range of motion.  Cardiovascular: Normal rate, regular rhythm and normal heart sounds.   Pulmonary/Chest: Effort normal and breath sounds normal.  Abdominal: Soft. She exhibits no distension. There is no tenderness.  Musculoskeletal: Normal range of motion.  Neurological: She is alert and oriented  to person, place, and time.  Skin: Skin is warm and dry.  Psychiatric: She has a normal mood and affect. Judgment normal.    ED Course  Procedures (including critical care time) Labs Review Labs Reviewed  CBC WITH DIFFERENTIAL - Abnormal; Notable for the following:    WBC 13.5 (*)    RBC 5.27 (*)    Hemoglobin 16.3 (*)    HCT 46.3 (*)    Neutro Abs 7.8 (*)    Lymphs Abs 4.4 (*)    Monocytes Absolute 1.3 (*)    All other components within normal limits  COMPREHENSIVE METABOLIC PANEL - Abnormal; Notable for the following:    Potassium 2.7 (*)    Chloride 90 (*)    Glucose, Bld 158 (*)    Creatinine, Ser 1.32 (*)    Calcium 11.3 (*)    Total Protein 9.8 (*)    GFR calc non Af Amer 47 (*)    GFR calc Af Amer 55 (*)    All other components within normal limits  LIPASE, BLOOD  URINALYSIS, ROUTINE W REFLEX MICROSCOPIC  PREGNANCY, URINE   Imaging Review No results found.  MDM   1. Gastroparesis    Symptomatic treatment at this time.  Abdominal exam is benign.  1:35 AM Patient feels much better this time.  Abdominal exam is benign.  She has a gastroenterologist, Dr. Loreta Ave, and I've asked that she  call her for followup.  She would discharge him with a prescription for Reglan.  Her potassium was repleted orally here in the emergency department.    Lyanne Co, MD 01/15/13 [phone]

## 2013-01-15 NOTE — ED Notes (Signed)
Dr Patria Mane made aware of critical potassium level

## 2013-04-08 ENCOUNTER — Emergency Department (HOSPITAL_COMMUNITY)
Admission: EM | Admit: 2013-04-08 | Discharge: 2013-04-08 | Disposition: A | Discharge status: Home / Self Care | Payer: 59 | Attending: Emergency Medicine | Admitting: Emergency Medicine

## 2013-04-08 ENCOUNTER — Encounter (HOSPITAL_COMMUNITY): Payer: Self-pay | Admitting: Emergency Medicine

## 2013-04-08 DIAGNOSIS — E860 Dehydration: Secondary | ICD-10-CM

## 2013-04-08 DIAGNOSIS — E876 Hypokalemia: Secondary | ICD-10-CM

## 2013-04-08 DIAGNOSIS — R5381 Other malaise: Secondary | ICD-10-CM | POA: Insufficient documentation

## 2013-04-08 DIAGNOSIS — Z8719 Personal history of other diseases of the digestive system: Secondary | ICD-10-CM | POA: Insufficient documentation

## 2013-04-08 DIAGNOSIS — Z880 Allergy status to penicillin: Secondary | ICD-10-CM | POA: Insufficient documentation

## 2013-04-08 DIAGNOSIS — F17200 Nicotine dependence, unspecified, uncomplicated: Secondary | ICD-10-CM | POA: Insufficient documentation

## 2013-04-08 DIAGNOSIS — R112 Nausea with vomiting, unspecified: Secondary | ICD-10-CM

## 2013-04-08 LAB — CBC WITH DIFFERENTIAL/PLATELET
Basophils Absolute: 0 10*3/uL (ref 0.0–0.1)
Basophils Relative: 0 % (ref 0–1)
Eosinophils Absolute: 0 10*3/uL (ref 0.0–0.7)
Eosinophils Relative: 0 % (ref 0–5)
HCT: 41.9 % (ref 36.0–46.0)
Lymphocytes Relative: 22 % (ref 12–46)
MCH: 30.5 pg (ref 26.0–34.0)
MCHC: 34.8 g/dL (ref 30.0–36.0)
MCV: 87.7 fL (ref 78.0–100.0)
Monocytes Absolute: 1.4 10*3/uL — ABNORMAL HIGH (ref 0.1–1.0)
Platelets: 303 10*3/uL (ref 150–400)
RDW: 13.4 % (ref 11.5–15.5)
WBC: 15.9 10*3/uL — ABNORMAL HIGH (ref 4.0–10.5)

## 2013-04-08 LAB — COMPREHENSIVE METABOLIC PANEL
ALT: 14 U/L (ref 0–35)
Albumin: 5 g/dL (ref 3.5–5.2)
Alkaline Phosphatase: 114 U/L (ref 39–117)
BUN: 14 mg/dL (ref 6–23)
Chloride: 94 mEq/L — ABNORMAL LOW (ref 96–112)
Glucose, Bld: 148 mg/dL — ABNORMAL HIGH (ref 70–99)
Potassium: 3.1 mEq/L — ABNORMAL LOW (ref 3.5–5.1)
Sodium: 135 mEq/L (ref 135–145)
Total Bilirubin: 0.7 mg/dL (ref 0.3–1.2)
Total Protein: 9.9 g/dL — ABNORMAL HIGH (ref 6.0–8.3)

## 2013-04-08 MED ORDER — FAMOTIDINE IN NACL 20-0.9 MG/50ML-% IV SOLN
20.0000 mg | Freq: Once | INTRAVENOUS | Status: AC
  Administered 2013-04-08: 20 mg via INTRAVENOUS
  Filled 2013-04-08: qty 50

## 2013-04-08 MED ORDER — SODIUM CHLORIDE 0.9 % IV SOLN: 1000.0000 mL | INTRAVENOUS | Status: DC

## 2013-04-08 MED ORDER — POTASSIUM CHLORIDE CRYS ER 20 MEQ PO TBCR: 20.0000 meq | EXTENDED_RELEASE_TABLET | Freq: Two times a day (BID) | ORAL | Status: DC

## 2013-04-08 MED ORDER — METOCLOPRAMIDE HCL 10 MG PO TABS: 10.0000 mg | ORAL_TABLET | Freq: Three times a day (TID) | ORAL | Status: DC

## 2013-04-08 MED ORDER — SODIUM CHLORIDE 0.9 % IV BOLUS (SEPSIS)
1000.0000 mL | Freq: Once | INTRAVENOUS | Status: AC
  Administered 2013-04-08: 1000 mL via INTRAVENOUS

## 2013-04-08 MED ORDER — ONDANSETRON HCL 4 MG/2ML IJ SOLN
4.0000 mg | Freq: Once | INTRAMUSCULAR | Status: AC
  Administered 2013-04-08: 4 mg via INTRAVENOUS
  Filled 2013-04-08: qty 2

## 2013-04-08 MED ORDER — METOCLOPRAMIDE HCL 5 MG/ML IJ SOLN
10.0000 mg | Freq: Once | INTRAMUSCULAR | Status: AC
  Administered 2013-04-08: 10 mg via INTRAVENOUS
  Filled 2013-04-08: qty 2

## 2013-04-08 MED ORDER — DIPHENHYDRAMINE HCL 50 MG/ML IJ SOLN
25.0000 mg | Freq: Once | INTRAMUSCULAR | Status: AC
  Administered 2013-04-08: 25 mg via INTRAVENOUS
  Filled 2013-04-08: qty 1

## 2013-04-08 MED ORDER — SODIUM CHLORIDE 0.9 % IV SOLN
1000.0000 mL | Freq: Once | INTRAVENOUS | Status: AC
  Administered 2013-04-08: 1000 mL via INTRAVENOUS

## 2013-04-08 NOTE — ED Notes (Signed)
Pt reports n/v and weakness since Monday. Hx of acid reflux and gastroparesis. Has been vomiting 10+ times per day. Denies abdominal pain or diarrhea.

## 2013-04-08 NOTE — Progress Notes (Signed)
[facility] provided pt with a list of primary care resources and a [facility] application.  °

## 2013-04-08 NOTE — ED Provider Notes (Signed)
CSN: [id number]     Arrival date & time 04/08/13  4098 History   First MD Initiated Contact with Patient 04/08/13 [phone]     Chief Complaint  Patient presents with  . Emesis   (Consider location/radiation/quality/duration/timing/severity/associated sxs/prior Treatment) HPI Patient reports 2 days ago about 4 AM in the morning she started having nausea and vomiting. She states she vomited over 10 times yesterday. She denies any diarrhea, abdominal pain or fever. She states she feels weak. She reports she ate tomatoes over the weekend which can make her have nausea and vomiting. She also states she's been evaluated for gallbladder disease and although she does not have gallstones, her gallbladder only functions at 1%. They have discussed cholecystectomy with her gastroenterologist however she has not seen a surgeon yet. She denies being around anybody else who is ill. She states these episodes happen about every 3-4 months.   PCP Dr Dolly Rias GI Dr Loreta Ave  Past Medical History  Diagnosis Date  . Reflux   . Gastroparesis    Past Surgical History  Procedure Laterality Date  . Myomectomy     No family history on file. History  Substance Use Topics  . Smoking status: Current Every Day Smoker -- 0.33 packs/day    Types: Cigarettes  . Smokeless tobacco: Not on file  . Alcohol Use: No   employed  OB History   Grav Para Term Preterm Abortions TAB SAB Ect Mult Living                 Review of Systems  All other systems reviewed and are negative.    Allergies  Penicillins  Home Medications  No current outpatient prescriptions on file.   BP 185/90  Pulse 81  Temp(Src) 98.9 F (37.2 C) (Oral)  Resp 24  SpO2 99%  Vital signs normal   Physical Exam  Nursing note and vitals reviewed. Constitutional: She is oriented to person, place, and time. She appears well-developed and well-nourished.  Non-toxic appearance. She does not appear ill. No distress.  Nauseated, leaning over.   HENT:  Head: Normocephalic and atraumatic.  Right Ear: External ear normal.  Left Ear: External ear normal.  Nose: Nose normal. No mucosal edema or rhinorrhea.  Mouth/Throat: Oropharynx is clear and moist and mucous membranes are normal. No dental abscesses or uvula swelling.  Eyes: Conjunctivae and EOM are normal. Pupils are equal, round, and reactive to light.  Neck: Normal range of motion and full passive range of motion without pain. Neck supple.  Cardiovascular: Normal rate, regular rhythm and normal heart sounds.  Exam reveals no gallop and no friction rub.   No murmur heard. Pulmonary/Chest: Effort normal and breath sounds normal. No respiratory distress. She has no wheezes. She has no rhonchi. She has no rales. She exhibits no tenderness and no crepitus.  Abdominal: Soft. Normal appearance and bowel sounds are normal. She exhibits no distension. There is no tenderness. There is no rebound and no guarding.  Musculoskeletal: Normal range of motion. She exhibits no edema and no tenderness.  Moves all extremities well.   Neurological: She is alert and oriented to person, place, and time. She has normal strength. No cranial nerve deficit.  Skin: Skin is warm, dry and intact. No rash noted. No erythema. No pallor.  Psychiatric: She has a normal mood and affect. Her speech is normal and behavior is normal. Her mood appears not anxious.    ED Course  Procedures (including critical care time)  Medications  0.9 %  sodium chloride infusion (0 mLs Intravenous Stopped 04/08/13 0900)    Followed by  0.9 %  sodium chloride infusion (not administered)  metoCLOPramide (REGLAN) injection 10 mg (10 mg Intravenous Given 04/08/13 0803)  diphenhydrAMINE (BENADRYL) injection 25 mg (25 mg Intravenous Given 04/08/13 0803)  sodium chloride 0.9 % bolus 1,000 mL (0 mLs Intravenous Stopped 04/08/13 1019)  famotidine (PEPCID) IVPB 20 mg (0 mg Intravenous Stopped 04/08/13 0956)  ondansetron (ZOFRAN)  injection 4 mg (4 mg Intravenous Given 04/08/13 0919)    Recheck 09:00, no urine output, states she needs something for "gas, like pepcid".  Given second liter and IV pepcid.  Recheck 10:12 feeling better, starting to feel hungry, is willing to try oral fluids.  Recheck 10:45 Pt has been drinking fluids and feels better, ready to go home.   Labs Review  Results for orders placed during the hospital encounter of 04/08/13  COMPREHENSIVE METABOLIC PANEL      Result Value Range   Sodium 135  135 - 145 mEq/L   Potassium 3.1 (*) 3.5 - 5.1 mEq/L   Chloride 94 (*) 96 - 112 mEq/L   CO2 25  19 - 32 mEq/L   Glucose, Bld 148 (*) 70 - 99 mg/dL   BUN 14  6 - 23 mg/dL   Creatinine, Ser 4.09  0.50 - 1.10 mg/dL   Calcium 81.1 (*) 8.4 - 10.5 mg/dL   Total Protein 9.9 (*) 6.0 - 8.3 g/dL   Albumin 5.0  3.5 - 5.2 g/dL   AST 18  0 - 37 U/L   ALT 14  0 - 35 U/L   Alkaline Phosphatase 114  39 - 117 U/L   Total Bilirubin 0.7  0.3 - 1.2 mg/dL   GFR calc non Af Amer 84 (*) >90 mL/min   GFR calc Af Amer >90  >90 mL/min  CBC WITH DIFFERENTIAL      Result Value Range   WBC 15.9 (*) 4.0 - 10.5 K/uL   RBC 4.78  3.87 - 5.11 MIL/uL   Hemoglobin 14.6  12.0 - 15.0 g/dL   HCT 91.4  78.2 - 95.6 %   MCV 87.7  78.0 - 100.0 fL   MCH 30.5  26.0 - 34.0 pg   MCHC 34.8  30.0 - 36.0 g/dL   RDW 21.3  08.6 - 57.8 %   Platelets 303  150 - 400 K/uL   Neutrophils Relative % 69  43 - 77 %   Neutro Abs 11.0 (*) 1.7 - 7.7 K/uL   Lymphocytes Relative 22  12 - 46 %   Lymphs Abs 3.5  0.7 - 4.0 K/uL   Monocytes Relative 9  3 - 12 %   Monocytes Absolute 1.4 (*) 0.1 - 1.0 K/uL   Eosinophils Relative 0  0 - 5 %   Eosinophils Absolute 0.0  0.0 - 0.7 K/uL   Basophils Relative 0  0 - 1 %   Basophils Absolute 0.0  0.0 - 0.1 K/uL   Laboratory interpretation all normal except hypokalemia   Imaging Review No results found.  EKG Interpretation   None       MDM   1. Nausea and vomiting   2. Dehydration   3.  Hypokalemia     New Prescriptions   METOCLOPRAMIDE (REGLAN) 10 MG TABLET    Take 1 tablet (10 mg total) by mouth 4 (four) times daily -  before meals and at bedtime.   POTASSIUM CHLORIDE SA (K-DUR,KLOR-CON) 20  MEQ TABLET    Take 1 tablet (20 mEq total) by mouth 2 (two) times daily.    Plan discharge   Devoria Albe, MD, Franz Dell, MD 04/08/13 1050

## 2013-04-08 NOTE — ED Notes (Signed)
Pt given apple juice and crackers per request 

## 2014-06-22 ENCOUNTER — Encounter (HOSPITAL_COMMUNITY): Payer: Self-pay | Admitting: Emergency Medicine

## 2014-06-22 ENCOUNTER — Emergency Department (HOSPITAL_COMMUNITY)
Admission: EM | Admit: 2014-06-22 | Discharge: 2014-06-22 | Disposition: A | Discharge status: Home / Self Care | Payer: Self-pay | Attending: Emergency Medicine | Admitting: Emergency Medicine

## 2014-06-22 DIAGNOSIS — Z3202 Encounter for pregnancy test, result negative: Secondary | ICD-10-CM | POA: Insufficient documentation

## 2014-06-22 DIAGNOSIS — Z720 Tobacco use: Secondary | ICD-10-CM | POA: Insufficient documentation

## 2014-06-22 DIAGNOSIS — Z880 Allergy status to penicillin: Secondary | ICD-10-CM | POA: Insufficient documentation

## 2014-06-22 DIAGNOSIS — E876 Hypokalemia: Secondary | ICD-10-CM | POA: Insufficient documentation

## 2014-06-22 DIAGNOSIS — R112 Nausea with vomiting, unspecified: Secondary | ICD-10-CM

## 2014-06-22 DIAGNOSIS — Z79899 Other long term (current) drug therapy: Secondary | ICD-10-CM | POA: Insufficient documentation

## 2014-06-22 DIAGNOSIS — R1084 Generalized abdominal pain: Secondary | ICD-10-CM | POA: Insufficient documentation

## 2014-06-22 DIAGNOSIS — K219 Gastro-esophageal reflux disease without esophagitis: Secondary | ICD-10-CM | POA: Insufficient documentation

## 2014-06-22 DIAGNOSIS — Z331 Pregnant state, incidental: Secondary | ICD-10-CM | POA: Insufficient documentation

## 2014-06-22 LAB — CBC WITH DIFFERENTIAL/PLATELET
BASOS ABS: 0 10*3/uL (ref 0.0–0.1)
Basophils Relative: 0 % (ref 0–1)
EOS PCT: 0 % (ref 0–5)
Eosinophils Absolute: 0 10*3/uL (ref 0.0–0.7)
HEMATOCRIT: 46.8 % — AB (ref 36.0–46.0)
Hemoglobin: 16.1 g/dL — ABNORMAL HIGH (ref 12.0–15.0)
LYMPHS ABS: 4 10*3/uL (ref 0.7–4.0)
Lymphocytes Relative: 26 % (ref 12–46)
MCH: 30.8 pg (ref 26.0–34.0)
MCHC: 34.4 g/dL (ref 30.0–36.0)
MCV: 89.5 fL (ref 78.0–100.0)
MONOS PCT: 10 % (ref 3–12)
Monocytes Absolute: 1.5 10*3/uL — ABNORMAL HIGH (ref 0.1–1.0)
NEUTROS ABS: 9.9 10*3/uL — AB (ref 1.7–7.7)
Neutrophils Relative %: 64 % (ref 43–77)
Platelets: 312 10*3/uL (ref 150–400)
RBC: 5.23 MIL/uL — AB (ref 3.87–5.11)
RDW: 13.5 % (ref 11.5–15.5)
WBC: 15.5 10*3/uL — AB (ref 4.0–10.5)

## 2014-06-22 LAB — COMPREHENSIVE METABOLIC PANEL
ALT: 17 U/L (ref 0–35)
ANION GAP: 13 (ref 5–15)
AST: 23 U/L (ref 0–37)
Albumin: 5 g/dL (ref 3.5–5.2)
Alkaline Phosphatase: 129 U/L — ABNORMAL HIGH (ref 39–117)
BILIRUBIN TOTAL: 1.1 mg/dL (ref 0.3–1.2)
BUN: 34 mg/dL — ABNORMAL HIGH (ref 6–23)
CHLORIDE: 93 mmol/L — AB (ref 96–112)
CO2: 31 mmol/L (ref 19–32)
Calcium: 10.5 mg/dL (ref 8.4–10.5)
Creatinine, Ser: 1.54 mg/dL — ABNORMAL HIGH (ref 0.50–1.10)
GFR, EST AFRICAN AMERICAN: 45 mL/min — AB (ref >90)
GFR, EST NON AFRICAN AMERICAN: 39 mL/min — AB (ref >90)
GLUCOSE: 168 mg/dL — AB (ref 70–99)
POTASSIUM: 2.9 mmol/L — AB (ref 3.5–5.1)
SODIUM: 137 mmol/L (ref 135–145)
Total Protein: 10.2 g/dL — ABNORMAL HIGH (ref 6.0–8.3)

## 2014-06-22 LAB — MAGNESIUM: Magnesium: 2.5 mg/dL (ref 1.5–2.5)

## 2014-06-22 LAB — I-STAT BETA HCG BLOOD, ED (MC, WL, AP ONLY): I-stat hCG, quantitative: 6 m[IU]/mL — ABNORMAL HIGH (ref <5)

## 2014-06-22 LAB — LIPASE, BLOOD: Lipase: 19 U/L (ref 11–59)

## 2014-06-22 MED ORDER — ONDANSETRON HCL 4 MG/2ML IJ SOLN
4.0000 mg | Freq: Once | INTRAMUSCULAR | Status: AC
  Administered 2014-06-22: 4 mg via INTRAVENOUS
  Filled 2014-06-22: qty 2

## 2014-06-22 MED ORDER — SODIUM CHLORIDE 0.9 % IV BOLUS (SEPSIS)
1000.0000 mL | Freq: Once | INTRAVENOUS | Status: AC
  Administered 2014-06-22: 1000 mL via INTRAVENOUS

## 2014-06-22 MED ORDER — METOCLOPRAMIDE HCL 5 MG/ML IJ SOLN
10.0000 mg | INTRAMUSCULAR | Status: AC
  Administered 2014-06-22: 10 mg via INTRAVENOUS
  Filled 2014-06-22: qty 2

## 2014-06-22 MED ORDER — METOCLOPRAMIDE HCL 10 MG PO TABS: 10.0000 mg | ORAL_TABLET | Freq: Three times a day (TID) | ORAL | Status: DC

## 2014-06-22 MED ORDER — POTASSIUM CHLORIDE CRYS ER 20 MEQ PO TBCR
60.0000 meq | EXTENDED_RELEASE_TABLET | Freq: Once | ORAL | Status: AC
  Administered 2014-06-22: 60 meq via ORAL
  Filled 2014-06-22: qty 3

## 2014-06-22 MED ORDER — FAMOTIDINE IN NACL 20-0.9 MG/50ML-% IV SOLN
20.0000 mg | Freq: Once | INTRAVENOUS | Status: AC
  Administered 2014-06-22: 20 mg via INTRAVENOUS
  Filled 2014-06-22: qty 50

## 2014-06-22 MED ORDER — POTASSIUM CHLORIDE 10 MEQ/100ML IV SOLN
10.0000 meq | Freq: Once | INTRAVENOUS | Status: AC
  Administered 2014-06-22: 10 meq via INTRAVENOUS
  Filled 2014-06-22: qty 100

## 2014-06-22 NOTE — ED Notes (Signed)
Kelly H. PA at bedside  

## 2014-06-22 NOTE — ED Provider Notes (Signed)
CSN: [id number]     Arrival date & time 06/22/14  0216 History   First MD Initiated Contact with Patient 06/22/14 [phone]     Chief Complaint  Patient presents with  . Emesis    (Consider location/radiation/quality/duration/timing/severity/associated sxs/prior Treatment) HPI Comments: Patient is a 49 year old female with a history of esophageal reflux and gastroparesis. She presents to the emergency department today complaining of nausea and vomiting 1 day. Patient states that she ate spicy chicken wings for dinner on Sunday evening. She states that she awoke Monday morning with the sensation of overwhelming nausea after which time she began vomiting. Patient reports TNTC episodes of emesis. She denies any hematemesis. No medications taken prior to arrival for symptoms. Patient states that she feels dehydrated, but does report urinating 2 yesterday. Patient had one bowel movement yesterday which was normal for her. She repeatedly states that she has a burning sensation in her chest and the back of her throat. She reports being followed by Dr. Loreta Ave of gastroenterology, but has not seen Dr. Loreta Ave in many years. No associated fever, shortness of breath, abdominal pain, urinary symptoms, or vaginal complaints. Abdominal surgical history significant for myomectomy.  Patient is a 49 y.o. female presenting with vomiting. The history is provided by the patient. No language interpreter was used.  Emesis   Past Medical History  Diagnosis Date  . Reflux   . Gastroparesis    Past Surgical History  Procedure Laterality Date  . Myomectomy     Family History  Problem Relation Age of Onset  . Cancer Mother    History  Substance Use Topics  . Smoking status: Current Every Day Smoker -- 0.33 packs/day    Types: Cigarettes  . Smokeless tobacco: Not on file  . Alcohol Use: No   OB History    No data available      Review of Systems  Gastrointestinal: Positive for nausea and vomiting.  All other  systems reviewed and are negative.   Allergies  Penicillins  Home Medications   Prior to Admission medications   Medication Sig Start Date End Date Taking? Authorizing Provider  metoCLOPramide (REGLAN) 10 MG tablet Take 1 tablet (10 mg total) by mouth 4 (four) times daily -  before meals and at bedtime. 06/22/14   Antony Madura, PA-C  potassium chloride SA (K-DUR,KLOR-CON) 20 MEQ tablet Take 1 tablet (20 mEq total) by mouth 2 (two) times daily. 04/08/13   Ward Givens, MD   BP 139/81 mmHg  Pulse 85  Temp(Src) 98.4 F (36.9 C) (Oral)  Resp 20  SpO2 98%  LMP 03/27/2012   Physical Exam  Constitutional: She is oriented to person, place, and time. She appears well-developed and well-nourished. No distress.  Nontoxic/nonseptic appearing  HENT:  Head: Normocephalic and atraumatic.  Eyes: Conjunctivae and EOM are normal. No scleral icterus.  Neck: Normal range of motion.  Cardiovascular: Normal rate, regular rhythm and intact distal pulses.   Pulmonary/Chest: Effort normal and breath sounds normal. No respiratory distress. She has no wheezes. She has no rales.  Respirations even and unlabored.  Abdominal: Soft. She exhibits no distension. There is tenderness. There is no rebound and no guarding.  Diffuse, mild tenderness to palpation. No focal tenderness or peritoneal signs. Abdomen soft. No guarding.  Musculoskeletal: Normal range of motion.  Neurological: She is alert and oriented to person, place, and time. She exhibits normal muscle tone. Coordination normal.  Patient ambulates in the hall with steady gait.  Skin: Skin is warm  and dry. No rash noted. She is not diaphoretic. No erythema. No pallor.  Psychiatric: She has a normal mood and affect. Her behavior is normal.  Nursing note and vitals reviewed.   ED Course  Procedures (including critical care time) Labs Review Labs Reviewed  CBC WITH DIFFERENTIAL/PLATELET - Abnormal; Notable for the following:    WBC 15.5 (*)    RBC 5.23  (*)    Hemoglobin 16.1 (*)    HCT 46.8 (*)    Neutro Abs 9.9 (*)    Monocytes Absolute 1.5 (*)    All other components within normal limits  COMPREHENSIVE METABOLIC PANEL - Abnormal; Notable for the following:    Potassium 2.9 (*)    Chloride 93 (*)    Glucose, Bld 168 (*)    BUN 34 (*)    Creatinine, Ser 1.54 (*)    Total Protein 10.2 (*)    Alkaline Phosphatase 129 (*)    GFR calc non Af Amer 39 (*)    GFR calc Af Amer 45 (*)    All other components within normal limits  I-STAT BETA HCG BLOOD, ED ([facility], [facility], [facility] ONLY) - Abnormal; Notable for the following:    I-stat hCG, quantitative 6.0 (*)    All other components within normal limits  LIPASE, BLOOD  MAGNESIUM    Imaging Review No results found.   EKG Interpretation None      MDM   Final diagnoses:  Non-intractable vomiting with nausea, vomiting of unspecified type  Chronic hypokalemia    49 year old female with a history of gastroparesis and esophageal reflux presents to the emergency department for nausea and vomiting after eating spicy chicken wings 2 nights ago. Patient with no complaints of abdominal pain. She has mild generalized tenderness to palpation. No focal tenderness or peritoneal signs. Patient is afebrile as well as hemodynamically stable. Her symptoms have been well controlled with Reglan and IV fluids. Patient found to be hypokalemic with a potassium of 2.9. This appears chronic as compared with prior admissions. Potassium repleted in ED. Elevated creatinine, leukocytosis, and H/H likely secondary to hemoconcentration/dehydration. Patient has been adequately hydrated with 2 L IV fluids.  Abdominal reexaminations stable over ED course. Patient states that she is feeling much better. She has been tolerating PO fluids in the ED without further emesis. She is stable for discharge at this time with instruction of follow-up with her primary care doctor for a recheck of symptoms as well as a recheck of her potassium  level. Patient was notified that her i-STAT hCG was 6. Have advised her to have a urine pregnancy repeated. Believe this is secondary to operator error; patient has not been sexually active in the past 5 months and has not had a menstrual cycle in the last 2 years. Patient agreeable to plan with no unaddressed concerns. Patient discharged in good condition; VSS.   Filed Vitals:   06/22/14 0224 06/22/14 0415 06/22/14 0654  BP: 142/81 141/92 139/81  Pulse: 101 81 85  Temp: 98.2 F (36.8 C) 98.5 F (36.9 C) 98.4 F (36.9 C)  TempSrc: Oral Oral Oral  Resp: 20 18 20   SpO2: 100% 97% 98%       Antony MaduraKelly Takelia Urieta, PA-C 06/22/14 0700  Olivia Mackielga M Otter, MD 06/22/14 [phone]

## 2014-06-22 NOTE — ED Notes (Addendum)
Patient states she had spicy chicken wings on Sunday night before going to bed. Pt woke up Monday morning with nausea. Pt has been offered Zofran but is demanding Reglan. Pt is denying abd pain but reports burning in throat from vomiting.

## 2014-06-22 NOTE — ED Notes (Signed)
Pt states she has had nausea and vomiting for the past two days

## 2014-06-22 NOTE — ED Notes (Signed)
Patient irate, yelling at staff, "I know what I need, but I can't get no service."  Currently says she is going to get something to drink from the soda machine, since we won't let her have anything to eat or drink at this time.

## 2014-06-22 NOTE — ED Notes (Addendum)
Patient walking around emergency department, asking various staff members to give her ice water/chips. Pt has a steady gait. Informed patient she could not have ice chips until evaluated. Pt ambulated out to lobby and bought herself some drink in the drink machine.

## 2014-06-22 NOTE — Discharge Instructions (Signed)
Nausea and Vomiting °Nausea is a sick feeling that often comes before throwing up (vomiting). Vomiting is a reflex where stomach contents come out of your mouth. Vomiting can cause severe loss of body fluids (dehydration). Children and elderly adults can become dehydrated quickly, especially if they also have diarrhea. Nausea and vomiting are symptoms of a condition or disease. It is important to find the cause of your symptoms. °CAUSES  °· Direct irritation of the stomach lining. This irritation can result from increased acid production (gastroesophageal reflux disease), infection, food poisoning, taking certain medicines (such as nonsteroidal anti-inflammatory drugs), alcohol use, or tobacco use. °· Signals from the brain. These signals could be caused by a headache, heat exposure, an inner ear disturbance, increased pressure in the brain from injury, infection, a tumor, or a concussion, pain, emotional stimulus, or metabolic problems. °· An obstruction in the gastrointestinal tract (bowel obstruction). °· Illnesses such as diabetes, hepatitis, gallbladder problems, appendicitis, kidney problems, cancer, sepsis, atypical symptoms of a heart attack, or eating disorders. °· Medical treatments such as chemotherapy and radiation. °· Receiving medicine that makes you sleep (general anesthetic) during surgery. °DIAGNOSIS °Your caregiver may ask for tests to be done if the problems do not improve after a few days. Tests may also be done if symptoms are severe or if the reason for the nausea and vomiting is not clear. Tests may include: °· Urine tests. °· Blood tests. °· Stool tests. °· Cultures (to look for evidence of infection). °· X-rays or other imaging studies. °Test results can help your caregiver make decisions about treatment or the need for additional tests. °TREATMENT °You need to stay well hydrated. Drink frequently but in small amounts. You may wish to drink water, sports drinks, clear broth, or eat frozen  ice pops or gelatin dessert to help stay hydrated. When you eat, eating slowly may help prevent nausea. There are also some antinausea medicines that may help prevent nausea. °HOME CARE INSTRUCTIONS  °· Take all medicine as directed by your caregiver. °· If you do not have an appetite, do not force yourself to eat. However, you must continue to drink fluids. °· If you have an appetite, eat a normal diet unless your caregiver tells you differently. °· Eat a variety of complex carbohydrates (rice, wheat, potatoes, bread), lean meats, yogurt, fruits, and vegetables. °· Avoid high-fat foods because they are more difficult to digest. °· Drink enough water and fluids to keep your urine clear or pale yellow. °· If you are dehydrated, ask your caregiver for specific rehydration instructions. Signs of dehydration may include: °· Severe thirst. °· Dry lips and mouth. °· Dizziness. °· Dark urine. °· Decreasing urine frequency and amount. °· Confusion. °· Rapid breathing or pulse. °SEEK IMMEDIATE MEDICAL CARE IF:  °· You have blood or brown flecks (like coffee grounds) in your vomit. °· You have black or bloody stools. °· You have a severe headache or stiff neck. °· You are confused. °· You have severe abdominal pain. °· You have chest pain or trouble breathing. °· You do not urinate at least once every 8 hours. °· You develop cold or clammy skin. °· You continue to vomit for longer than 24 to 48 hours. °· You have a fever. °MAKE SURE YOU:  °· Understand these instructions. °· Will watch your condition. °· Will get help right away if you are not doing well or get worse. °Document Released: 05/07/2005 Document Revised: 07/30/2011 Document Reviewed: 10/04/2010 °ExitCare® Patient Information ©2015 [employer]. This information is not intended   to replace advice given to you by your health care provider. Make sure you discuss any questions you have with your health care provider. °Hypokalemia °Hypokalemia means that the amount of  potassium in the blood is lower than normal. Potassium is a chemical, called an electrolyte, that helps regulate the amount of fluid in the body. It also stimulates muscle contraction and helps nerves function properly. Most of the body's potassium is inside of cells, and only a very small amount is in the blood. Because the amount in the blood is so small, minor changes can be life-threatening. °CAUSES °· Antibiotics. °· Diarrhea or vomiting. °· Using laxatives too much, which can cause diarrhea. °· Chronic kidney disease. °· Water pills (diuretics). °· Eating disorders (bulimia). °· Low magnesium level. °· Sweating a lot. °SIGNS AND SYMPTOMS °· Weakness. °· Constipation. °· Fatigue. °· Muscle cramps. °· Mental confusion. °· Skipped heartbeats or irregular heartbeat (palpitations). °· Tingling or numbness. °DIAGNOSIS  °Your health care provider can diagnose hypokalemia with blood tests. In addition to checking your potassium level, your health care provider may also check other lab tests. °TREATMENT °Hypokalemia can be treated with potassium supplements taken by mouth or adjustments in your current medicines. If your potassium level is very low, you may need to get potassium through a vein (IV) and be monitored in the hospital. A diet high in potassium is also helpful. Foods high in potassium are: °· Nuts, such as peanuts and pistachios. °· Seeds, such as sunflower seeds and pumpkin seeds. °· Peas, lentils, and lima beans. °· Whole grain and bran cereals and breads. °· Fresh fruit and vegetables, such as apricots, avocado, bananas, cantaloupe, kiwi, oranges, tomatoes, asparagus, and potatoes. °· Orange and tomato juices. °· Red meats. °· Fruit yogurt. °HOME CARE INSTRUCTIONS °· Take all medicines as prescribed by your health care provider. °· Maintain a healthy diet by including nutritious food, such as fruits, vegetables, nuts, whole grains, and lean meats. °· If you are taking a laxative, be sure to follow the  directions on the label. °SEEK MEDICAL CARE IF: °· Your weakness gets worse. °· You feel your heart pounding or racing. °· You are vomiting or having diarrhea. °· You are diabetic and having trouble keeping your blood glucose in the normal range. °SEEK IMMEDIATE MEDICAL CARE IF: °· You have chest pain, shortness of breath, or dizziness. °· You are vomiting or having diarrhea for more than 2 days. °· You faint. °MAKE SURE YOU:  °· Understand these instructions. °· Will watch your condition. °· Will get help right away if you are not doing well or get worse. °Document Released: 05/07/2005 Document Revised: 02/25/2013 Document Reviewed: 11/07/2012 °ExitCare® Patient Information ©2015 [employer]. This information is not intended to replace advice given to you by your health care provider. Make sure you discuss any questions you have with your health care provider. ° °

## 2014-10-04 ENCOUNTER — Emergency Department (HOSPITAL_COMMUNITY)
Admission: EM | Admit: 2014-10-04 | Discharge: 2014-10-05 | Disposition: A | Discharge status: Home / Self Care | Payer: BLUE CROSS/BLUE SHIELD | Attending: Emergency Medicine | Admitting: Emergency Medicine

## 2014-10-04 DIAGNOSIS — R112 Nausea with vomiting, unspecified: Secondary | ICD-10-CM

## 2014-10-04 DIAGNOSIS — Z720 Tobacco use: Secondary | ICD-10-CM | POA: Insufficient documentation

## 2014-10-04 DIAGNOSIS — Z880 Allergy status to penicillin: Secondary | ICD-10-CM | POA: Diagnosis not present

## 2014-10-04 DIAGNOSIS — E876 Hypokalemia: Secondary | ICD-10-CM

## 2014-10-04 DIAGNOSIS — K219 Gastro-esophageal reflux disease without esophagitis: Secondary | ICD-10-CM | POA: Diagnosis not present

## 2014-10-05 ENCOUNTER — Encounter (HOSPITAL_COMMUNITY): Payer: Self-pay

## 2014-10-05 LAB — I-STAT CHEM 8, ED
BUN: 33 mg/dL — ABNORMAL HIGH (ref 6–20)
CALCIUM ION: 1.09 mmol/L — AB (ref 1.12–1.23)
CREATININE: 1.4 mg/dL — AB (ref 0.44–1.00)
Chloride: 94 mmol/L — ABNORMAL LOW (ref 101–111)
GLUCOSE: 158 mg/dL — AB (ref 65–99)
HCT: 49 % — ABNORMAL HIGH (ref 36.0–46.0)
HEMOGLOBIN: 16.7 g/dL — AB (ref 12.0–15.0)
POTASSIUM: 3.1 mmol/L — AB (ref 3.5–5.1)
Sodium: 137 mmol/L (ref 135–145)
TCO2: 28 mmol/L (ref 0–100)

## 2014-10-05 MED ORDER — METOCLOPRAMIDE HCL 5 MG/ML IJ SOLN
10.0000 mg | Freq: Once | INTRAMUSCULAR | Status: AC
  Administered 2014-10-05: 10 mg via INTRAVENOUS
  Filled 2014-10-05: qty 2

## 2014-10-05 MED ORDER — ONDANSETRON HCL 4 MG PO TABS: 4.0000 mg | ORAL_TABLET | Freq: Four times a day (QID) | ORAL | Status: DC

## 2014-10-05 MED ORDER — METOCLOPRAMIDE HCL 10 MG PO TABS: 10.0000 mg | ORAL_TABLET | Freq: Four times a day (QID) | ORAL | Status: DC

## 2014-10-05 MED ORDER — OMEPRAZOLE 20 MG PO CPDR: 20.0000 mg | DELAYED_RELEASE_CAPSULE | Freq: Every day | ORAL | Status: DC

## 2014-10-05 MED ORDER — POTASSIUM CHLORIDE CRYS ER 20 MEQ PO TBCR
40.0000 meq | EXTENDED_RELEASE_TABLET | Freq: Once | ORAL | Status: AC
  Administered 2014-10-05: 40 meq via ORAL
  Filled 2014-10-05: qty 2

## 2014-10-05 MED ORDER — SODIUM CHLORIDE 0.9 % IV BOLUS (SEPSIS)
1000.0000 mL | Freq: Once | INTRAVENOUS | Status: AC
  Administered 2014-10-05: 1000 mL via INTRAVENOUS

## 2014-10-05 NOTE — Discharge Instructions (Signed)

## 2014-10-05 NOTE — ED Notes (Signed)
PO challenge given Pt given ginger ale per PA

## 2014-10-05 NOTE — ED Notes (Signed)
Pt states "you have my records over here, I normally get reglan.  Can you just do that?"  Pt made aware that she has to see a doctor to have an order for meds.

## 2014-10-05 NOTE — ED Notes (Signed)
Patient cursing at this nurse in relation to the wait time. Patient states she was told three beds were being cleaned. Patient was advised outside of a life threatening emergency come in she would be the next person to be taken to a room. Patient was advised she needs to stop cursing or she will be asked to leave. Patient writing down the names of staff members as she encounters them. Patient was given contact information for AD Barbara Cower([employer]). Patient states she will be filing a complaint.

## 2014-10-05 NOTE — ED Notes (Signed)
Pt reported chest pain after being placed back in the lobby, pt put back into triage room for EKG.

## 2014-10-05 NOTE — ED Notes (Signed)
Pt presents with c/o vomiting that has been going on since yesterday morning. Pt reports this is her "acid reflux attack as usual". Pt denies any diarrhea or abdominal pain.

## 2014-10-08 NOTE — ED Provider Notes (Signed)
CSN: [id number]     Arrival date & time 10/04/14  2332 History   First MD Initiated Contact with Patient 10/05/14 [phone]     Chief Complaint  Patient presents with  . Emesis     (Consider location/radiation/quality/duration/timing/severity/associated sxs/prior Treatment) Patient is a 49 y.o. female presenting with vomiting. The history is provided by the patient. No language interpreter was used.  Emesis Severity:  Moderate Duration:  1 day Associated symptoms: abdominal pain   Associated symptoms: no chills and no diarrhea   Associated symptoms comment:  Complains of nausea and vomiting since yesterday without fever. She has a history of similar symptoms and reports "all I need is some fluids and some Reglan". No diarrhea. No hematemesis. She has cramping abdominal pain that is nonfocal.   Past Medical History  Diagnosis Date  . Reflux   . Gastroparesis    Past Surgical History  Procedure Laterality Date  . Myomectomy     Family History  Problem Relation Age of Onset  . Cancer Mother    History  Substance Use Topics  . Smoking status: Current Every Day Smoker -- 0.33 packs/day    Types: Cigarettes  . Smokeless tobacco: Not on file  . Alcohol Use: No   OB History    No data available     Review of Systems  Constitutional: Negative for fever and chills.  Respiratory: Negative.   Cardiovascular: Negative.   Gastrointestinal: Positive for vomiting and abdominal pain. Negative for diarrhea.  Musculoskeletal: Negative.   Skin: Negative.   Neurological: Negative.       Allergies  Penicillins  Home Medications   Prior to Admission medications   Medication Sig Start Date End Date Taking? Authorizing Provider  metoCLOPramide (REGLAN) 10 MG tablet Take 1 tablet (10 mg total) by mouth every 6 (six) hours. 10/05/14   Elpidio AnisShari Evanne Matsunaga, PA-C  omeprazole (PRILOSEC) 20 MG capsule Take 1 capsule (20 mg total) by mouth daily. 10/05/14   Elpidio AnisShari Geral Tuch, PA-C  ondansetron (ZOFRAN)  4 MG tablet Take 1 tablet (4 mg total) by mouth every 6 (six) hours. 10/05/14   Elpidio AnisShari Addysin Porco, PA-C  potassium chloride SA (K-DUR,KLOR-CON) 20 MEQ tablet Take 1 tablet (20 mEq total) by mouth 2 (two) times daily. Patient not taking: Reported on 10/05/2014 04/08/13   Devoria AlbeIva Knapp, MD   BP 138/75 mmHg  Pulse 89  Temp(Src) 99.4 F (37.4 C) (Oral)  Resp 18  SpO2 96%  LMP 03/27/2012 Physical Exam  Constitutional: She is oriented to person, place, and time. She appears well-developed and well-nourished.  HENT:  Head: Normocephalic.  Neck: Normal range of motion. Neck supple.  Cardiovascular: Normal rate and regular rhythm.   Pulmonary/Chest: Effort normal and breath sounds normal.  Abdominal: Soft. Bowel sounds are normal. There is tenderness. There is no rebound and no guarding.  Diffuse, mild tenderness of abdomen.  Musculoskeletal: Normal range of motion.  Neurological: She is alert and oriented to person, place, and time.  Skin: Skin is warm and dry. No rash noted.  Psychiatric: She has a normal mood and affect.    ED Course  Procedures (including critical care time) Labs Review Labs Reviewed  I-STAT CHEM 8, ED - Abnormal; Notable for the following:    Potassium 3.1 (*)    Chloride 94 (*)    BUN 33 (*)    Creatinine, Ser 1.40 (*)    Glucose, Bld 158 (*)    Calcium, Ion 1.09 (*)    Hemoglobin 16.7 (*)  HCT 49.0 (*)    All other components within normal limits    Imaging Review No results found.   EKG Interpretation   Date/Time:  Tuesday Oct 05 2014 01:39:08 EDT Ventricular Rate:  77 PR Interval:  120 QRS Duration: 84 QT Interval:  514 QTC Calculation: 582 R Axis:   81 Text Interpretation:  Sinus rhythm Biatrial enlargement Prolonged QT  interval Confirmed by OTTER  MD, OLGA ([id number]) on 10/05/2014 3:12:32 AM      MDM   Final diagnoses:  Non-intractable vomiting with nausea, vomiting of unspecified type  Hypokalemia    Tolerating PO fluids after medications  and fluids. Potassium supplement provided for mild hypokalemia. VSS. She if felt stable for discharge home.     Elpidio AnisShari Adayah Arocho, PA-C 10/08/14 [phone]  Marisa Severinlga Otter, MD 10/08/14 [phone]

## 2018-10-12 ENCOUNTER — Encounter (HOSPITAL_COMMUNITY): Payer: Self-pay

## 2018-10-12 ENCOUNTER — Emergency Department (HOSPITAL_COMMUNITY)
Admission: EM | Admit: 2018-10-12 | Discharge: 2018-10-12 | Disposition: A | Discharge status: Home / Self Care | Payer: BLUE CROSS/BLUE SHIELD | Attending: Emergency Medicine | Admitting: Emergency Medicine

## 2018-10-12 ENCOUNTER — Other Ambulatory Visit: Payer: Self-pay

## 2018-10-12 DIAGNOSIS — R112 Nausea with vomiting, unspecified: Secondary | ICD-10-CM | POA: Insufficient documentation

## 2018-10-12 DIAGNOSIS — F17210 Nicotine dependence, cigarettes, uncomplicated: Secondary | ICD-10-CM | POA: Insufficient documentation

## 2018-10-12 DIAGNOSIS — E876 Hypokalemia: Secondary | ICD-10-CM

## 2018-10-12 DIAGNOSIS — Z5320 Procedure and treatment not carried out because of patient's decision for unspecified reasons: Secondary | ICD-10-CM | POA: Insufficient documentation

## 2018-10-12 DIAGNOSIS — Z1159 Encounter for screening for other viral diseases: Secondary | ICD-10-CM | POA: Insufficient documentation

## 2018-10-12 LAB — CBC WITH DIFFERENTIAL/PLATELET
Abs Immature Granulocytes: 0.07 10*3/uL (ref 0.00–0.07)
Basophils Absolute: 0 10*3/uL (ref 0.0–0.1)
Basophils Relative: 0 %
Eosinophils Absolute: 0.2 10*3/uL (ref 0.0–0.5)
Eosinophils Relative: 1 %
HCT: 47.6 % — ABNORMAL HIGH (ref 36.0–46.0)
Hemoglobin: 16.1 g/dL — ABNORMAL HIGH (ref 12.0–15.0)
Immature Granulocytes: 1 %
Lymphocytes Relative: 31 %
Lymphs Abs: 4.7 10*3/uL — ABNORMAL HIGH (ref 0.7–4.0)
MCH: 30.4 pg (ref 26.0–34.0)
MCHC: 33.8 g/dL (ref 30.0–36.0)
MCV: 90 fL (ref 80.0–100.0)
Monocytes Absolute: 1.1 10*3/uL — ABNORMAL HIGH (ref 0.1–1.0)
Monocytes Relative: 7 %
Neutro Abs: 9 10*3/uL — ABNORMAL HIGH (ref 1.7–7.7)
Neutrophils Relative %: 60 %
Platelets: 294 10*3/uL (ref 150–400)
RBC: 5.29 MIL/uL — ABNORMAL HIGH (ref 3.87–5.11)
RDW: 13.1 % (ref 11.5–15.5)
WBC: 15.1 10*3/uL — ABNORMAL HIGH (ref 4.0–10.5)
nRBC: 0 % (ref 0.0–0.2)

## 2018-10-12 LAB — TROPONIN I
Troponin I: 0.05 ng/mL (ref <0.03)
Troponin I: 0.06 ng/mL (ref <0.03)

## 2018-10-12 LAB — COMPREHENSIVE METABOLIC PANEL
ALT: 18 U/L (ref 0–44)
AST: 23 U/L (ref 15–41)
Albumin: 5.2 g/dL — ABNORMAL HIGH (ref 3.5–5.0)
Alkaline Phosphatase: 97 U/L (ref 38–126)
Anion gap: 18 — ABNORMAL HIGH (ref 5–15)
BUN: 47 mg/dL — ABNORMAL HIGH (ref 6–20)
CO2: 32 mmol/L (ref 22–32)
Calcium: 10.5 mg/dL — ABNORMAL HIGH (ref 8.9–10.3)
Chloride: 87 mmol/L — ABNORMAL LOW (ref 98–111)
Creatinine, Ser: 2.25 mg/dL — ABNORMAL HIGH (ref 0.44–1.00)
GFR calc Af Amer: 28 mL/min — ABNORMAL LOW (ref >60)
GFR calc non Af Amer: 24 mL/min — ABNORMAL LOW (ref >60)
Glucose, Bld: 157 mg/dL — ABNORMAL HIGH (ref 70–99)
Potassium: 2.7 mmol/L — CL (ref 3.5–5.1)
Sodium: 137 mmol/L (ref 135–145)
Total Bilirubin: 1.3 mg/dL — ABNORMAL HIGH (ref 0.3–1.2)
Total Protein: 10.2 g/dL — ABNORMAL HIGH (ref 6.5–8.1)

## 2018-10-12 LAB — SARS CORONAVIRUS 2 BY RT PCR (HOSPITAL ORDER, PERFORMED IN ~~LOC~~ HOSPITAL LAB): SARS Coronavirus 2: NEGATIVE

## 2018-10-12 LAB — LIPASE, BLOOD: Lipase: 31 U/L (ref 11–51)

## 2018-10-12 LAB — BASIC METABOLIC PANEL
Anion gap: 13 (ref 5–15)
BUN: 37 mg/dL — ABNORMAL HIGH (ref 6–20)
CO2: 29 mmol/L (ref 22–32)
Calcium: 9.3 mg/dL (ref 8.9–10.3)
Chloride: 95 mmol/L — ABNORMAL LOW (ref 98–111)
Creatinine, Ser: 1.48 mg/dL — ABNORMAL HIGH (ref 0.44–1.00)
GFR calc Af Amer: 46 mL/min — ABNORMAL LOW (ref >60)
GFR calc non Af Amer: 40 mL/min — ABNORMAL LOW (ref >60)
Glucose, Bld: 141 mg/dL — ABNORMAL HIGH (ref 70–99)
Potassium: 2.7 mmol/L — CL (ref 3.5–5.1)
Sodium: 137 mmol/L (ref 135–145)

## 2018-10-12 MED ORDER — SODIUM CHLORIDE 0.9 % IV BOLUS
1000.0000 mL | Freq: Once | INTRAVENOUS | Status: AC
  Administered 2018-10-12: 1000 mL via INTRAVENOUS

## 2018-10-12 MED ORDER — METOCLOPRAMIDE HCL 5 MG/ML IJ SOLN
10.0000 mg | Freq: Once | INTRAMUSCULAR | Status: AC
  Administered 2018-10-12: 10 mg via INTRAVENOUS
  Filled 2018-10-12: qty 2

## 2018-10-12 MED ORDER — POTASSIUM CHLORIDE 10 MEQ/100ML IV SOLN
10.0000 meq | INTRAVENOUS | Status: AC
  Administered 2018-10-12: 10 meq via INTRAVENOUS
  Filled 2018-10-12 (×2): qty 100

## 2018-10-12 MED ORDER — POTASSIUM CHLORIDE CRYS ER 20 MEQ PO TBCR
40.0000 meq | EXTENDED_RELEASE_TABLET | Freq: Once | ORAL | Status: AC
  Administered 2018-10-12: 40 meq via ORAL
  Filled 2018-10-12: qty 2

## 2018-10-12 NOTE — ED Provider Notes (Signed)
[facility]-EMERGENCY DEPT Provider Note   CSN: [id number] Arrival date & time: 10/12/18  1018    History   Chief Complaint Chief Complaint  Patient presents with  . Gastroesophageal Reflux    HPI [name] is a 53 y.o. female.     HPI Patient states she has a history of gastroesophageal reflux disease.  She is not currently taking any medication for this.  Thursday she ate some bacon and began having some central chest discomfort which resolved.  She then developed nausea and is had multiple episodes of vomiting for the last 2 days.  She is had difficulty tolerating oral intake.  She denies grossly bloody or melanotic stools.  No fever or chills.  Currently denies any chest discomfort.  States this is similar to past exacerbations of her reflux disease.  She is requesting injection of Reglan. Past Medical History:  Diagnosis Date  . Gastroparesis   . Reflux     There are no active problems to display for this patient.   Past Surgical History:  Procedure Laterality Date  . MYOMECTOMY       OB History   No obstetric history on file.      Home Medications    Prior to Admission medications   Medication Sig Start Date End Date Taking? Authorizing Provider  metoCLOPramide (REGLAN) 10 MG tablet Take 1 tablet (10 mg total) by mouth every 6 (six) hours. Patient not taking: Reported on 10/12/2018 10/05/14   Elpidio Anis, PA-C  omeprazole (PRILOSEC) 20 MG capsule Take 1 capsule (20 mg total) by mouth daily. Patient not taking: Reported on 10/12/2018 10/05/14   Elpidio Anis, PA-C  ondansetron (ZOFRAN) 4 MG tablet Take 1 tablet (4 mg total) by mouth every 6 (six) hours. Patient not taking: Reported on 10/12/2018 10/05/14   Elpidio Anis, PA-C  potassium chloride SA (K-DUR,KLOR-CON) 20 MEQ tablet Take 1 tablet (20 mEq total) by mouth 2 (two) times daily. Patient not taking: Reported on 10/05/2014 04/08/13   Devoria Albe, MD    Family History Family  History  Problem Relation Age of Onset  . Cancer Mother     Social History Social History   Tobacco Use  . Smoking status: Current Every Day Smoker    Packs/day: 0.33    Types: Cigarettes  Substance Use Topics  . Alcohol use: No  . Drug use: No     Allergies   Penicillins   Review of Systems Review of Systems  Constitutional: Negative for chills and fever.  HENT: Negative for sore throat.   Respiratory: Negative for cough and shortness of breath.   Gastrointestinal: Positive for abdominal pain, nausea and vomiting. Negative for blood in stool, constipation and diarrhea.  Musculoskeletal: Negative for back pain, myalgias and neck pain.  Skin: Negative for rash.  Neurological: Negative for dizziness, weakness, light-headedness, numbness and headaches.  All other systems reviewed and are negative.    Physical Exam Updated Vital Signs BP (!) 149/91   Pulse 78   Temp 98.3 F (36.8 C) (Oral)   Resp 17   Ht  (1.753 m)   Wt 81.6 kg   LMP 03/27/2012   SpO2 100%   BMI 26.58 kg/m   Physical Exam Vitals signs and nursing note reviewed.  Constitutional:      Appearance: Normal appearance. She is well-developed.  HENT:     Head: Normocephalic and atraumatic.     Mouth/Throat:     Mouth: Mucous membranes are moist.  Eyes:     Pupils: Pupils are equal, round, and reactive to light.  Neck:     Musculoskeletal: Normal range of motion and neck supple.  Cardiovascular:     Rate and Rhythm: Normal rate and regular rhythm.     Heart sounds: No murmur. No friction rub. No gallop.   Pulmonary:     Effort: Pulmonary effort is normal. No respiratory distress.     Breath sounds: Normal breath sounds. No stridor. No wheezing, rhonchi or rales.  Chest:     Chest wall: No tenderness.  Abdominal:     General: Bowel sounds are normal. There is no distension.     Palpations: Abdomen is soft. There is no mass.     Tenderness: There is no abdominal tenderness. There is no  right CVA tenderness, left CVA tenderness, guarding or rebound.     Hernia: No hernia is present.  Musculoskeletal: Normal range of motion.        General: No swelling, tenderness, deformity or signs of injury.     Right lower leg: No edema.     Left lower leg: No edema.  Skin:    General: Skin is warm and dry.     Findings: No erythema or rash.  Neurological:     General: No focal deficit present.     Mental Status: She is alert and oriented to person, place, and time.  Psychiatric:        Mood and Affect: Mood normal.        Behavior: Behavior normal.      ED Treatments / Results  Labs (all labs ordered are listed, but only abnormal results are displayed) Labs Reviewed  CBC WITH DIFFERENTIAL/PLATELET - Abnormal; Notable for the following components:      Result Value   WBC 15.1 (*)    RBC 5.29 (*)    Hemoglobin 16.1 (*)    HCT 47.6 (*)    Neutro Abs 9.0 (*)    Lymphs Abs 4.7 (*)    Monocytes Absolute 1.1 (*)    All other components within normal limits  COMPREHENSIVE METABOLIC PANEL - Abnormal; Notable for the following components:   Potassium 2.7 (*)    Chloride 87 (*)    Glucose, Bld 157 (*)    BUN 47 (*)    Creatinine, Ser 2.25 (*)    Calcium 10.5 (*)    Total Protein 10.2 (*)    Albumin 5.2 (*)    Total Bilirubin 1.3 (*)    GFR calc non Af Amer 24 (*)    GFR calc Af Amer 28 (*)    Anion gap 18 (*)    All other components within normal limits  TROPONIN I - Abnormal; Notable for the following components:   Troponin I 0.05 (*)    All other components within normal limits  BASIC METABOLIC PANEL - Abnormal; Notable for the following components:   Potassium 2.7 (*)    Chloride 95 (*)    Glucose, Bld 141 (*)    BUN 37 (*)    Creatinine, Ser 1.48 (*)    GFR calc non Af Amer 40 (*)    GFR calc Af Amer 46 (*)    All other components within normal limits  TROPONIN I - Abnormal; Notable for the following components:   Troponin I 0.06 (*)    All other components  within normal limits  SARS CORONAVIRUS 2 (HOSPITAL ORDER, PERFORMED IN [facility])  LIPASE, BLOOD  EKG EKG Interpretation  Date/Time:  Sunday Oct 12 2018 10:54:34 EDT Ventricular Rate:  81 PR Interval:    QRS Duration: 96 QT Interval:  498 QTC Calculation: 579 R Axis:   77 Text Interpretation:  Sinus rhythm Borderline short PR interval Biatrial enlargement Minimal ST depression, inferior leads Prolonged QT interval Confirmed by Loren RacerYelverton, Kataya Guimont ([id number]) on 10/12/2018 2:29:59 PM   Radiology No results found.  Procedures Procedures (including critical care time)  Medications Ordered in ED Medications  potassium chloride 10 mEq in 100 mL IVPB (10 mEq Intravenous Refused 10/12/18 1439)  sodium chloride 0.9 % bolus 1,000 mL (0 mLs Intravenous Stopped 10/12/18 1218)  metoCLOPramide (REGLAN) injection 10 mg (10 mg Intravenous Given 10/12/18 1047)  sodium chloride 0.9 % bolus 1,000 mL (0 mLs Intravenous Stopped 10/12/18 1407)  potassium chloride SA (K-DUR) CR tablet 40 mEq (40 mEq Oral Given 10/12/18 1444)     Initial Impression / Assessment and Plan / ED Course  I have reviewed the triage vital signs and the nursing notes.  Pertinent labs & imaging results that were available during my care of the patient were reviewed by me and considered in my medical decision making (see chart for details).        Patient is refusing admission for AKI and hypokalemia.  Suspect very mild elevation of troponin is related to her AKI.  Initiated IV fluids and potassium replacement.  Patient excellently pulled her IV out while trying to get up to go to the bathroom.  She is refusing second IV placement.  She did consent to straight stick for repeat BMP and troponin.  Vital signs remained stable.  Patient is had no further vomiting in the emergency department. Patient left prior to repeat BMP and repeat troponin. Final Clinical Impressions(s) / ED Diagnoses   Final diagnoses:   Hypokalemia    ED Discharge Orders    None       Loren RacerYelverton, Kenise Barraco, MD 10/14/18 2029

## 2018-10-12 NOTE — ED Notes (Signed)
Pt standing outside asking to leave.  MD made aware, and pt informed he is working on her paperwork.

## 2018-10-12 NOTE — ED Notes (Signed)
Pt stated that she didn't want to wait for her d/c paperwork and needed to leave. Pt ambulatory with steady gait. Pt advised to stay.

## 2018-10-12 NOTE — ED Notes (Signed)
Date and time results received: 10/12/18 1154 (use smartphrase ".now" to insert current time)  Test: K+ 2.7 Critical Value: Trop 0.05  Name of Provider Notified: Notified Maddie, RN  Orders Received? Or Actions Taken?: Actions Taken: Occupational hygienist

## 2018-10-12 NOTE — ED Triage Notes (Signed)
Pt states that she is having "an acid reflux attack". Pt states she needs a shot of reglan. Pt c/o heartburn after eating bacon several days.

## 2018-10-12 NOTE — ED Notes (Addendum)
Pt left before the labs resulted. Received from lab that pt has K+ 2.7 and Trop 0.06. Attempted to call pt and there was no answer at the number provided. I was reluctant to leave vmail due to generic vmail message with no name. Will notify the night charge to call pt back to notify and have the abnormal lab values addressed.

## 2018-10-12 NOTE — ED Notes (Addendum)
Pt needed to use the bathroom.  Pt pulled out her IV to get up.  pt's call bell was in bed beside her.

## 2018-10-12 NOTE — ED Notes (Addendum)
Pt informed she has another dose of potassium ordered for her.  This RN offered to start another IV so she could finish her potassium, as well as her fluid bolus.  Pt stated she would rather take a potassium pill and go home.

## 2018-10-12 NOTE — ED Notes (Signed)
Pt given ice chips and cherry popscicles per pt request.

## 2019-07-19 ENCOUNTER — Other Ambulatory Visit: Payer: Self-pay

## 2019-07-19 ENCOUNTER — Emergency Department (HOSPITAL_COMMUNITY)
Admission: EM | Admit: 2019-07-19 | Discharge: 2019-07-19 | Disposition: A | Discharge status: Home / Self Care | Payer: Self-pay | Attending: Emergency Medicine | Admitting: Emergency Medicine

## 2019-07-19 ENCOUNTER — Encounter (HOSPITAL_COMMUNITY): Payer: Self-pay | Admitting: Emergency Medicine

## 2019-07-19 DIAGNOSIS — R112 Nausea with vomiting, unspecified: Secondary | ICD-10-CM | POA: Insufficient documentation

## 2019-07-19 DIAGNOSIS — F17210 Nicotine dependence, cigarettes, uncomplicated: Secondary | ICD-10-CM | POA: Insufficient documentation

## 2019-07-19 DIAGNOSIS — R030 Elevated blood-pressure reading, without diagnosis of hypertension: Secondary | ICD-10-CM | POA: Insufficient documentation

## 2019-07-19 DIAGNOSIS — R1013 Epigastric pain: Secondary | ICD-10-CM | POA: Insufficient documentation

## 2019-07-19 LAB — COMPREHENSIVE METABOLIC PANEL
ALT: 18 U/L (ref 0–44)
AST: 21 U/L (ref 15–41)
Albumin: 4.9 g/dL (ref 3.5–5.0)
Alkaline Phosphatase: 91 U/L (ref 38–126)
Anion gap: 13 (ref 5–15)
BUN: 28 mg/dL — ABNORMAL HIGH (ref 6–20)
CO2: 30 mmol/L (ref 22–32)
Calcium: 10.3 mg/dL (ref 8.9–10.3)
Chloride: 92 mmol/L — ABNORMAL LOW (ref 98–111)
Creatinine, Ser: 1.06 mg/dL — ABNORMAL HIGH (ref 0.44–1.00)
GFR calc Af Amer: >60 mL/min (ref >60)
GFR calc non Af Amer: 60 mL/min — ABNORMAL LOW (ref >60)
Glucose, Bld: 141 mg/dL — ABNORMAL HIGH (ref 70–99)
Potassium: 3.5 mmol/L (ref 3.5–5.1)
Sodium: 135 mmol/L (ref 135–145)
Total Bilirubin: 1 mg/dL (ref 0.3–1.2)
Total Protein: 9.5 g/dL — ABNORMAL HIGH (ref 6.5–8.1)

## 2019-07-19 LAB — CBC WITH DIFFERENTIAL/PLATELET
Abs Immature Granulocytes: 0.05 10*3/uL (ref 0.00–0.07)
Basophils Absolute: 0 10*3/uL (ref 0.0–0.1)
Basophils Relative: 0 %
Eosinophils Absolute: 0 10*3/uL (ref 0.0–0.5)
Eosinophils Relative: 0 %
HCT: 47.3 % — ABNORMAL HIGH (ref 36.0–46.0)
Hemoglobin: 15.8 g/dL — ABNORMAL HIGH (ref 12.0–15.0)
Immature Granulocytes: 0 %
Lymphocytes Relative: 24 %
Lymphs Abs: 3.2 10*3/uL (ref 0.7–4.0)
MCH: 30.3 pg (ref 26.0–34.0)
MCHC: 33.4 g/dL (ref 30.0–36.0)
MCV: 90.8 fL (ref 80.0–100.0)
Monocytes Absolute: 1.1 10*3/uL — ABNORMAL HIGH (ref 0.1–1.0)
Monocytes Relative: 8 %
Neutro Abs: 9 10*3/uL — ABNORMAL HIGH (ref 1.7–7.7)
Neutrophils Relative %: 68 %
Platelets: 270 10*3/uL (ref 150–400)
RBC: 5.21 MIL/uL — ABNORMAL HIGH (ref 3.87–5.11)
RDW: 12.9 % (ref 11.5–15.5)
WBC: 13.4 10*3/uL — ABNORMAL HIGH (ref 4.0–10.5)
nRBC: 0 % (ref 0.0–0.2)

## 2019-07-19 LAB — LIPASE, BLOOD: Lipase: 21 U/L (ref 11–51)

## 2019-07-19 MED ORDER — OMEPRAZOLE 20 MG PO CPDR: 20.0000 mg | DELAYED_RELEASE_CAPSULE | Freq: Every day | ORAL | 0 refills | Status: AC

## 2019-07-19 MED ORDER — METOCLOPRAMIDE HCL 5 MG/ML IJ SOLN
10.0000 mg | Freq: Once | INTRAMUSCULAR | Status: AC
  Administered 2019-07-19: 11:00:00 10 mg via INTRAVENOUS
  Filled 2019-07-19: qty 2

## 2019-07-19 MED ORDER — SODIUM CHLORIDE 0.9 % IV BOLUS: 500.0000 mL | Freq: Once | INTRAVENOUS | Status: DC

## 2019-07-19 MED ORDER — FAMOTIDINE IN NACL 20-0.9 MG/50ML-% IV SOLN: 20.0000 mg | Freq: Once | INTRAVENOUS | Status: DC

## 2019-07-19 MED ORDER — METOCLOPRAMIDE HCL 10 MG PO TABS: 10.0000 mg | ORAL_TABLET | Freq: Four times a day (QID) | ORAL | 0 refills | Status: AC

## 2019-07-19 MED ORDER — SODIUM CHLORIDE 0.9 % IV BOLUS
1000.0000 mL | Freq: Once | INTRAVENOUS | Status: AC
  Administered 2019-07-19: 1000 mL via INTRAVENOUS

## 2019-07-19 NOTE — Discharge Instructions (Addendum)
You were seen in the emergency department today for nausea and vomiting.  Your labs show that you appear to be somewhat dehydrated, please be sure to drink plenty of fluids.  Your kidney function is mildly elevated, your protein is mildly elevated, your white blood cell count is mildly elevated-these are each labs to have rechecked by your primary care provider within 1 week.  We are sending you home with Reglan to take every 6 hours as needed for nausea and vomiting as well as omeprazole to take daily in the morning.with stomach acidity.  We have prescribed you new medication(s) today. Discuss the medications prescribed today with your pharmacist as they can have adverse effects and interactions with your other medicines including over the counter and prescribed medications. Seek medical evaluation if you start to experience new or abnormal symptoms after taking one of these medicines, seek care immediately if you start to experience difficulty breathing, feeling of your throat closing, facial swelling, or rash as these could be indications of a more serious allergic reaction  Please follow attached diet guidelines.  Please follow with your primary care provider within 3 days for reevaluation as well as a recheck of your blood pressure as it was elevated in the ER today.  Return to the emergency department for new or worsening symptoms including but not limited to inability to keep fluids down, fever, increased pain, chest pain, trouble breathing, blood in your stool, or any other concerns.

## 2019-07-19 NOTE — ED Triage Notes (Signed)
Pt c/o n/v x 3 days. Reports has acid reflux and gets attacks from time to time. Denies pain or urine or bowel problems.

## 2019-07-19 NOTE — ED Provider Notes (Signed)
[facility]-EMERGENCY DEPT Provider Note   CSN: [id number] Arrival date & time: 07/19/19  5366     History Chief Complaint  Patient presents with  . Emesis    [name] is a 54 y.o. female with a history of tobacco abuse, GERD, & gastroparesis who presents to the ED with complaints of N/V x 3 days. Patient reports that she has had too numerous to count episodes of emesis with associated burning type pain to the epigastric region. No alleviating/aggravating factors. She states this feels like her reflux/gastroparesis acting up and that she needs reglan and IV fluids. Denies fever, chills, chest pain, dyspnea, diarrhea, melena, hematochezia, or dysuria.   HPI     Past Medical History:  Diagnosis Date  . Gastroparesis   . Reflux     There are no problems to display for this patient.   Past Surgical History:  Procedure Laterality Date  . MYOMECTOMY       OB History   No obstetric history on file.     Family History  Problem Relation Age of Onset  . Cancer Mother     Social History   Tobacco Use  . Smoking status: Current Every Day Smoker    Packs/day: 0.33    Types: Cigarettes  Substance Use Topics  . Alcohol use: No  . Drug use: No    Home Medications Prior to Admission medications   Medication Sig Start Date End Date Taking? Authorizing Provider  metoCLOPramide (REGLAN) 10 MG tablet Take 1 tablet (10 mg total) by mouth every 6 (six) hours. Patient not taking: Reported on 10/12/2018 10/05/14   Elpidio Anis, PA-C  omeprazole (PRILOSEC) 20 MG capsule Take 1 capsule (20 mg total) by mouth daily. Patient not taking: Reported on 10/12/2018 10/05/14   Elpidio Anis, PA-C  ondansetron (ZOFRAN) 4 MG tablet Take 1 tablet (4 mg total) by mouth every 6 (six) hours. Patient not taking: Reported on 10/12/2018 10/05/14   Elpidio Anis, PA-C  potassium chloride SA (K-DUR,KLOR-CON) 20 MEQ tablet Take 1 tablet (20 mEq total) by mouth 2 (two) times  daily. Patient not taking: Reported on 10/05/2014 04/08/13   Devoria Albe, MD    Allergies    Penicillins  Review of Systems   Review of Systems  Constitutional: Negative for chills and fever.  Respiratory: Negative for shortness of breath.   Cardiovascular: Negative for chest pain.  Gastrointestinal: Positive for abdominal pain, nausea and vomiting. Negative for blood in stool, constipation and diarrhea.  Genitourinary: Negative for dysuria, vaginal bleeding and vaginal discharge.  Neurological: Negative for syncope.  All other systems reviewed and are negative.   Physical Exam Updated Vital Signs BP (!) 157/105   Pulse 79   Temp 99.1 F (37.3 C) (Oral)   Resp 18   LMP 03/27/2012   SpO2 97%   Physical Exam Vitals and nursing note reviewed.  Constitutional:      General: She is not in acute distress.    Appearance: She is well-developed. She is not toxic-appearing.  HENT:     Head: Normocephalic and atraumatic.     Mouth/Throat:     Mouth: Mucous membranes are dry.  Eyes:     General:        Right eye: No discharge.        Left eye: No discharge.     Conjunctiva/sclera: Conjunctivae normal.  Cardiovascular:     Rate and Rhythm: Normal rate and regular rhythm.  Pulmonary:  Effort: Pulmonary effort is normal. No respiratory distress.     Breath sounds: Normal breath sounds. No wheezing, rhonchi or rales.  Abdominal:     General: There is no distension.     Palpations: Abdomen is soft.     Tenderness: There is abdominal tenderness (epigastric). There is no right CVA tenderness, left CVA tenderness, guarding or rebound. Negative signs include Murphy's sign and McBurney's sign.  Musculoskeletal:     Cervical back: Neck supple.  Skin:    General: Skin is warm and dry.     Findings: No rash.  Neurological:     Mental Status: She is alert.     Comments: Clear speech.   Psychiatric:        Behavior: Behavior normal.     ED Results / Procedures / Treatments     Labs (all labs ordered are listed, but only abnormal results are displayed) Labs Reviewed  COMPREHENSIVE METABOLIC PANEL - Abnormal; Notable for the following components:      Result Value   Chloride 92 (*)    Glucose, Bld 141 (*)    BUN 28 (*)    Creatinine, Ser 1.06 (*)    Total Protein 9.5 (*)    GFR calc non Af Amer 60 (*)    All other components within normal limits  CBC WITH DIFFERENTIAL/PLATELET - Abnormal; Notable for the following components:   WBC 13.4 (*)    RBC 5.21 (*)    Hemoglobin 15.8 (*)    HCT 47.3 (*)    Neutro Abs 9.0 (*)    Monocytes Absolute 1.1 (*)    All other components within normal limits  LIPASE, BLOOD  I-STAT BETA HCG BLOOD, ED ([facility], [facility], [facility] ONLY)    EKG EKG Interpretation  Date/Time:  Sunday July 19 2019 10:21:15 EST Ventricular Rate:  75 PR Interval:    QRS Duration: 86 QT Interval:  435 QTC Calculation: 486 R Axis:   81 Text Interpretation: Sinus rhythm Borderline prolonged QT interval Non-specific ST-t changes Confirmed by Cathren Laine ([id number]) on 07/19/2019 10:35:33 AM   Radiology No results found.  Procedures Procedures (including critical care time)  Medications Ordered in ED Medications  sodium chloride 0.9 % bolus 1,000 mL (1,000 mLs Intravenous New Bag/Given 07/19/19 1040)  metoCLOPramide (REGLAN) injection 10 mg (10 mg Intravenous Given 07/19/19 1041)    ED Course  I have reviewed the triage vital signs and the nursing notes.  Pertinent labs & imaging results that were available during my care of the patient were reviewed by me and considered in my medical decision making (see chart for details).    [name] was evaluated in Emergency Department on 07/19/2019 for the symptoms described in the history of present illness. He/she was evaluated in the context of the global COVID-19 pandemic, which necessitated consideration that the patient might be at risk for infection with the SARS-CoV-2 virus that causes COVID-19.  Institutional protocols and algorithms that pertain to the evaluation of patients at risk for COVID-19 are in a state of rapid change based on information released by regulatory bodies including the CDC and federal and state organizations. These policies and algorithms were followed during the patient's care in the ED.  MDM Rules/Calculators/A&P                      Patient presents to the ED with complaints of N/V w/ burning in the epigastric area x 3 days.  Patient is nontoxic-appearing, resting comfortably,  vitals WNL with exception of elevated blood pressure, doubt HTN emergency-PCP recheck.  On exam she has mild tenderness to the epigastric area without peritoneal signs.  Negative Murphy's.  She states that this feels similar to her prior gastroparesis/GERD and that she needs Reglan and fluids.  Will obtain labs and administer these medicines.  EKG: No STEMI Labs reviewed and interpreted by me. CBC: Mild leukocytosis, no anemia.  Elevated Hgb/HCT potentially due to hemoconcentration with dehydration. CMP: Renal function similar to prior.  No significant electrolyte derangement.  Elevated total protein improved from prior, may be dehydration related as well.  LFTs within normal limits. Lipase: Within normal limits  12:00: RE-EVAL: Patient feeling better, will PO challenge.   12:29: RE-EVAL: Patient requesting to be discharged, she feels much better, she is tolerating PO. She denies urinary sxs, has no lower abdominal tenderness, will discontinue urinalysis as I doubt UTI. Repeat abdominal exam without peritoneal signs-do not suspect cholecystitis, pancreatitis, perf, obstruction, or other acute surgical abdomen. Will discharge home with supportive medicines & diet recommendations. PCP follow up. I discussed results, treatment plan, need for follow-up, and return precautions with the patient. Provided opportunity for questions, patient confirmed understanding and is in agreement with plan.     Final Clinical Impression(s) / ED Diagnoses Final diagnoses:  Non-intractable vomiting with nausea, unspecified vomiting type    Rx / DC Orders ED Discharge Orders         Ordered    metoCLOPramide (REGLAN) 10 MG tablet  Every 6 hours     07/19/19 1231    omeprazole (PRILOSEC) 20 MG capsule  Daily     07/19/19 [address] [place], PA-C 07/19/19 1233    Lajean Saver, MD 07/19/19 1312

## 2022-05-23 ENCOUNTER — Encounter (HOSPITAL_BASED_OUTPATIENT_CLINIC_OR_DEPARTMENT_OTHER): Payer: Self-pay

## 2022-05-23 ENCOUNTER — Emergency Department (HOSPITAL_BASED_OUTPATIENT_CLINIC_OR_DEPARTMENT_OTHER)
Admission: EM | Admit: 2022-05-23 | Discharge: 2022-05-23 | Discharge status: Against Medical Advice | Payer: Self-pay | Attending: Emergency Medicine | Admitting: Emergency Medicine

## 2022-05-23 ENCOUNTER — Other Ambulatory Visit: Payer: Self-pay

## 2022-05-23 DIAGNOSIS — Z5321 Procedure and treatment not carried out due to patient leaving prior to being seen by health care provider: Secondary | ICD-10-CM | POA: Insufficient documentation

## 2022-05-23 DIAGNOSIS — B974 Respiratory syncytial virus as the cause of diseases classified elsewhere: Secondary | ICD-10-CM | POA: Insufficient documentation

## 2022-05-23 DIAGNOSIS — Z1152 Encounter for screening for COVID-19: Secondary | ICD-10-CM | POA: Insufficient documentation

## 2022-05-23 DIAGNOSIS — R197 Diarrhea, unspecified: Secondary | ICD-10-CM | POA: Insufficient documentation

## 2022-05-23 DIAGNOSIS — R112 Nausea with vomiting, unspecified: Secondary | ICD-10-CM | POA: Insufficient documentation

## 2022-05-23 LAB — COMPREHENSIVE METABOLIC PANEL
ALT: 16 U/L (ref 0–44)
AST: 22 U/L (ref 15–41)
Albumin: 4.7 g/dL (ref 3.5–5.0)
Alkaline Phosphatase: 82 U/L (ref 38–126)
Anion gap: 17 — ABNORMAL HIGH (ref 5–15)
BUN: 67 mg/dL — ABNORMAL HIGH (ref 6–20)
CO2: 28 mmol/L (ref 22–32)
Calcium: 10.5 mg/dL — ABNORMAL HIGH (ref 8.9–10.3)
Chloride: 86 mmol/L — ABNORMAL LOW (ref 98–111)
Creatinine, Ser: 1.94 mg/dL — ABNORMAL HIGH (ref 0.44–1.00)
GFR, Estimated: 30 mL/min — ABNORMAL LOW (ref >60)
Glucose, Bld: 187 mg/dL — ABNORMAL HIGH (ref 70–99)
Potassium: 3 mmol/L — ABNORMAL LOW (ref 3.5–5.1)
Sodium: 131 mmol/L — ABNORMAL LOW (ref 135–145)
Total Bilirubin: 0.7 mg/dL (ref 0.3–1.2)
Total Protein: 9.2 g/dL — ABNORMAL HIGH (ref 6.5–8.1)

## 2022-05-23 LAB — LIPASE, BLOOD: Lipase: 45 U/L (ref 11–51)

## 2022-05-23 LAB — CBC
HCT: 48.1 % — ABNORMAL HIGH (ref 36.0–46.0)
Hemoglobin: 16.4 g/dL — ABNORMAL HIGH (ref 12.0–15.0)
MCH: 29.6 pg (ref 26.0–34.0)
MCHC: 34.1 g/dL (ref 30.0–36.0)
MCV: 86.8 fL (ref 80.0–100.0)
Platelets: 307 10*3/uL (ref 150–400)
RBC: 5.54 MIL/uL — ABNORMAL HIGH (ref 3.87–5.11)
RDW: 13.1 % (ref 11.5–15.5)
WBC: 10.5 10*3/uL (ref 4.0–10.5)
nRBC: 0 % (ref 0.0–0.2)

## 2022-05-23 LAB — RESP PANEL BY RT-PCR (RSV, FLU A&B, COVID)  RVPGX2
Influenza A by PCR: NEGATIVE
Influenza B by PCR: NEGATIVE
Resp Syncytial Virus by PCR: POSITIVE — AB
SARS Coronavirus 2 by RT PCR: NEGATIVE

## 2022-05-23 LAB — URINALYSIS, ROUTINE W REFLEX MICROSCOPIC
Bacteria, UA: NONE SEEN
Bilirubin Urine: NEGATIVE
Glucose, UA: NEGATIVE mg/dL
Ketones, ur: NEGATIVE mg/dL
Leukocytes,Ua: NEGATIVE
Nitrite: NEGATIVE
Protein, ur: 30 mg/dL — AB
Specific Gravity, Urine: 1.029 (ref 1.005–1.030)
pH: 5 (ref 5.0–8.0)

## 2022-05-23 MED ORDER — ONDANSETRON HCL 4 MG/2ML IJ SOLN
4.0000 mg | Freq: Once | INTRAMUSCULAR | Status: DC
  Filled 2022-05-23: qty 2

## 2022-05-23 MED ORDER — METOCLOPRAMIDE HCL 5 MG/ML IJ SOLN
10.0000 mg | Freq: Once | INTRAMUSCULAR | Status: AC
  Administered 2022-05-23: 10 mg via INTRAVENOUS
  Filled 2022-05-23: qty 2

## 2022-05-23 NOTE — ED Triage Notes (Signed)
Patient here POV from Home.  Endorses N/V that began 3-4 Days ago. Had Diarrhea initially as well that subsided. No Known fevers.  NAD Noted during Triage. A&Ox4. GCS 15. Ambulatory.

## 2023-04-28 ENCOUNTER — Encounter (HOSPITAL_COMMUNITY): Payer: Self-pay

## 2023-04-28 ENCOUNTER — Other Ambulatory Visit: Payer: Self-pay

## 2023-04-28 ENCOUNTER — Emergency Department (HOSPITAL_COMMUNITY)
Admission: EM | Admit: 2023-04-28 | Discharge: 2023-04-28 | Disposition: A | Discharge status: Home / Self Care | Payer: Self-pay | Attending: Emergency Medicine | Admitting: Emergency Medicine

## 2023-04-28 DIAGNOSIS — K219 Gastro-esophageal reflux disease without esophagitis: Secondary | ICD-10-CM

## 2023-04-28 DIAGNOSIS — R1013 Epigastric pain: Secondary | ICD-10-CM | POA: Insufficient documentation

## 2023-04-28 DIAGNOSIS — E876 Hypokalemia: Secondary | ICD-10-CM | POA: Insufficient documentation

## 2023-04-28 DIAGNOSIS — N189 Chronic kidney disease, unspecified: Secondary | ICD-10-CM

## 2023-04-28 DIAGNOSIS — R112 Nausea with vomiting, unspecified: Secondary | ICD-10-CM | POA: Insufficient documentation

## 2023-04-28 LAB — URINALYSIS, ROUTINE W REFLEX MICROSCOPIC
Bilirubin Urine: NEGATIVE
Glucose, UA: 50 mg/dL — AB
Ketones, ur: NEGATIVE mg/dL
Leukocytes,Ua: NEGATIVE
Nitrite: NEGATIVE
Protein, ur: 100 mg/dL — AB
Specific Gravity, Urine: 1.029 (ref 1.005–1.030)
pH: 5 (ref 5.0–8.0)

## 2023-04-28 LAB — COMPREHENSIVE METABOLIC PANEL
ALT: 20 U/L (ref 0–44)
AST: 24 U/L (ref 15–41)
Albumin: 4.5 g/dL (ref 3.5–5.0)
Alkaline Phosphatase: 89 U/L (ref 38–126)
Anion gap: 16 — ABNORMAL HIGH (ref 5–15)
BUN: 52 mg/dL — ABNORMAL HIGH (ref 6–20)
CO2: 31 mmol/L (ref 22–32)
Calcium: 10.3 mg/dL (ref 8.9–10.3)
Chloride: 87 mmol/L — ABNORMAL LOW (ref 98–111)
Creatinine, Ser: 1.81 mg/dL — ABNORMAL HIGH (ref 0.44–1.00)
GFR, Estimated: 32 mL/min — ABNORMAL LOW (ref >60)
Glucose, Bld: 157 mg/dL — ABNORMAL HIGH (ref 70–99)
Potassium: 3.1 mmol/L — ABNORMAL LOW (ref 3.5–5.1)
Sodium: 134 mmol/L — ABNORMAL LOW (ref 135–145)
Total Bilirubin: 0.9 mg/dL (ref <1.2)
Total Protein: 9.4 g/dL — ABNORMAL HIGH (ref 6.5–8.1)

## 2023-04-28 LAB — CBC
HCT: 46.4 % — ABNORMAL HIGH (ref 36.0–46.0)
Hemoglobin: 16 g/dL — ABNORMAL HIGH (ref 12.0–15.0)
MCH: 30.4 pg (ref 26.0–34.0)
MCHC: 34.5 g/dL (ref 30.0–36.0)
MCV: 88.2 fL (ref 80.0–100.0)
Platelets: 270 10*3/uL (ref 150–400)
RBC: 5.26 MIL/uL — ABNORMAL HIGH (ref 3.87–5.11)
RDW: 13.1 % (ref 11.5–15.5)
WBC: 13.8 10*3/uL — ABNORMAL HIGH (ref 4.0–10.5)
nRBC: 0 % (ref 0.0–0.2)

## 2023-04-28 LAB — LIPASE, BLOOD: Lipase: 31 U/L (ref 11–51)

## 2023-04-28 MED ORDER — METOCLOPRAMIDE HCL 5 MG/ML IJ SOLN
10.0000 mg | Freq: Once | INTRAMUSCULAR | Status: AC
  Administered 2023-04-28: 10 mg via INTRAVENOUS
  Filled 2023-04-28: qty 2

## 2023-04-28 MED ORDER — SODIUM CHLORIDE 0.9 % IV BOLUS
1500.0000 mL | Freq: Once | INTRAVENOUS | Status: AC
  Administered 2023-04-28: 1500 mL via INTRAVENOUS

## 2023-04-28 MED ORDER — POTASSIUM CHLORIDE CRYS ER 20 MEQ PO TBCR: 20.0000 meq | EXTENDED_RELEASE_TABLET | Freq: Once | ORAL | Status: DC

## 2023-04-28 MED ORDER — POTASSIUM CHLORIDE 10 MEQ/100ML IV SOLN
10.0000 meq | Freq: Once | INTRAVENOUS | Status: AC
  Administered 2023-04-28: 10 meq via INTRAVENOUS
  Filled 2023-04-28: qty 100

## 2023-04-28 MED ORDER — PANTOPRAZOLE SODIUM 40 MG IV SOLR
40.0000 mg | Freq: Once | INTRAVENOUS | Status: AC
  Administered 2023-04-28: 40 mg via INTRAVENOUS
  Filled 2023-04-28: qty 10

## 2023-04-28 MED ORDER — SODIUM CHLORIDE 0.9 % IV SOLN: INTRAVENOUS | Status: DC

## 2023-04-28 NOTE — ED Provider Notes (Signed)
[name] EMERGENCY DEPARTMENT AT [facility] Provider Note   CSN: [id number] Arrival date & time: 04/28/23  1454     History  Chief Complaint  Patient presents with   Nausea   Emesis    [name] is a 57 y.o. female.  57 year old female presents with nausea vomiting epigastric pain times several days.  History of similar symptoms in the past.  States that she can keep down liquids but when she tries to externalize it does not work.  Denies any fever or chills.  Her emesis has been nonbilious or bloody.  Denies any diarrhea.  No blood in her stool.  Denies any use of alcohol but does smoke occasionally.       Home Medications Prior to Admission medications   Medication Sig Start Date End Date Taking? Authorizing Provider  metoCLOPramide (REGLAN) 10 MG tablet Take 1 tablet (10 mg total) by mouth every 6 (six) hours. 07/19/19   Petrucelli, Lelon Mast R, PA-C  omeprazole (PRILOSEC) 20 MG capsule Take 1 capsule (20 mg total) by mouth daily. 07/19/19   Petrucelli, Samantha R, PA-C  potassium chloride SA (K-DUR,KLOR-CON) 20 MEQ tablet Take 1 tablet (20 mEq total) by mouth 2 (two) times daily. Patient not taking: Reported on 10/05/2014 04/08/13 07/19/19  Devoria Albe, MD      Allergies    Penicillins    Review of Systems   Review of Systems  All other systems reviewed and are negative.   Physical Exam Updated Vital Signs BP (!) 132/97 (BP Location: Right Arm)   Pulse (!) 102   Temp 98.9 F (37.2 C) (Oral)   Resp 18   Ht 1.753 m (5\' 9" )   Wt 81.6 kg   LMP 03/27/2012   SpO2 100%   BMI 26.58 kg/m  Physical Exam Vitals and nursing note reviewed.  Constitutional:      General: She is not in acute distress.    Appearance: Normal appearance. She is well-developed. She is not toxic-appearing.  HENT:     Head: Normocephalic and atraumatic.  Eyes:     General: Lids are normal.     Conjunctiva/sclera: Conjunctivae normal.     Pupils: Pupils are equal, round,  and reactive to light.  Neck:     Thyroid: No thyroid mass.     Trachea: No tracheal deviation.  Cardiovascular:     Rate and Rhythm: Normal rate and regular rhythm.     Heart sounds: Normal heart sounds. No murmur heard.    No gallop.  Pulmonary:     Effort: Pulmonary effort is normal. No respiratory distress.     Breath sounds: Normal breath sounds. No stridor. No decreased breath sounds, wheezing, rhonchi or rales.  Abdominal:     General: There is no distension.     Palpations: Abdomen is soft.     Tenderness: There is no abdominal tenderness. There is no rebound.  Musculoskeletal:        General: No tenderness. Normal range of motion.     Cervical back: Normal range of motion and neck supple.  Skin:    General: Skin is warm and dry.     Findings: No abrasion or rash.  Neurological:     Mental Status: She is alert and oriented to person, place, and time. Mental status is at baseline.     GCS: GCS eye subscore is 4. GCS verbal subscore is 5. GCS motor subscore is 6.     Cranial Nerves: Cranial nerves are  intact. No cranial nerve deficit.     Sensory: No sensory deficit.     Motor: Motor function is intact.  Psychiatric:        Attention and Perception: Attention normal.        Speech: Speech normal.        Behavior: Behavior normal.     ED Results / Procedures / Treatments   Labs (all labs ordered are listed, but only abnormal results are displayed) Labs Reviewed  LIPASE, BLOOD  COMPREHENSIVE METABOLIC PANEL  CBC  URINALYSIS, ROUTINE W REFLEX MICROSCOPIC    EKG EKG Interpretation Date/Time:  Sunday April 28 2023 15:39:12 EST Ventricular Rate:  88 PR Interval:  124 QRS Duration:  93 QT Interval:  431 QTC Calculation: 522 R Axis:   54  Text Interpretation: Sinus rhythm RAE, consider biatrial enlargement Abnormal R-wave progression, early transition Prolonged QT interval No significant change since last tracing Confirmed by Lorre Nick ([id number]) on 04/28/2023  5:18:27 PM  Radiology No results found.  Procedures Procedures    Medications Ordered in ED Medications  sodium chloride 0.9 % bolus 1,500 mL (has no administration in time range)  0.9 %  sodium chloride infusion (has no administration in time range)  metoCLOPramide (REGLAN) injection 10 mg (has no administration in time range)  pantoprazole (PROTONIX) injection 40 mg (has no administration in time range)    ED Course/ Medical Decision Making/ A&P                                 Medical Decision Making Amount and/or Complexity of Data Reviewed Labs: ordered. ECG/medicine tests: ordered.  Risk Prescription drug management.   The patient is EKG shows normal sinus rhythm.  Mild hypokalemia noted on her electrolytes.  Given IV potassium.  She did not tolerate the entire dose and pulled her IV out.  Patient given food here and able to tolerate it well.  Will discharge home.        Final Clinical Impression(s) / ED Diagnoses Final diagnoses:  None    Rx / DC Orders ED Discharge Orders     None         Lorre Nick, MD 04/28/23 1829

## 2023-04-28 NOTE — ED Notes (Signed)
Pt pulled IV.

## 2023-04-28 NOTE — ED Triage Notes (Signed)
Pt arrived via POV. C/o N/V for 4x days- pt states they feel it is due to their acid reflux.  AOx4

## 2023-04-28 NOTE — Discharge Instructions (Addendum)
Follow-up with your doctor this week for a repeat potassium check

## 2023-04-28 NOTE — ED Notes (Signed)
PT pulled IV and requesting PO potassium and work note
